# Patient Record
Sex: Female | Born: 1990 | Race: White | Hispanic: No | Marital: Single | Smoking: Never smoker
Health system: Southern US, Community
[De-identification: ages and names within clinical notes are randomized; demographics above are authoritative.]

## PROBLEM LIST (undated history)

## (undated) DIAGNOSIS — F329 Major depressive disorder, single episode, unspecified: Secondary | ICD-10-CM

## (undated) DIAGNOSIS — K589 Irritable bowel syndrome without diarrhea: Secondary | ICD-10-CM

## (undated) DIAGNOSIS — F32A Depression, unspecified: Secondary | ICD-10-CM

## (undated) DIAGNOSIS — I639 Cerebral infarction, unspecified: Secondary | ICD-10-CM

## (undated) DIAGNOSIS — I509 Heart failure, unspecified: Secondary | ICD-10-CM

## (undated) DIAGNOSIS — E559 Vitamin D deficiency, unspecified: Secondary | ICD-10-CM

## (undated) DIAGNOSIS — N809 Endometriosis, unspecified: Secondary | ICD-10-CM

## (undated) DIAGNOSIS — F4310 Post-traumatic stress disorder, unspecified: Secondary | ICD-10-CM

## (undated) DIAGNOSIS — E282 Polycystic ovarian syndrome: Secondary | ICD-10-CM

## (undated) DIAGNOSIS — B009 Herpesviral infection, unspecified: Secondary | ICD-10-CM

## (undated) DIAGNOSIS — I429 Cardiomyopathy, unspecified: Secondary | ICD-10-CM

## (undated) HISTORY — DX: Heart failure, unspecified: I50.9

## (undated) HISTORY — PX: TONSILLECTOMY: SUR1361

---
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
## 2012-05-13 DIAGNOSIS — R634 Abnormal weight loss: Secondary | ICD-10-CM | POA: Insufficient documentation

## 2012-05-13 DIAGNOSIS — Z8742 Personal history of other diseases of the female genital tract: Secondary | ICD-10-CM | POA: Insufficient documentation

## 2012-08-11 DIAGNOSIS — I491 Atrial premature depolarization: Secondary | ICD-10-CM | POA: Insufficient documentation

## 2013-02-26 DIAGNOSIS — Z8659 Personal history of other mental and behavioral disorders: Secondary | ICD-10-CM | POA: Insufficient documentation

## 2013-06-11 DIAGNOSIS — Z6835 Body mass index (BMI) 35.0-35.9, adult: Secondary | ICD-10-CM | POA: Insufficient documentation

## 2014-12-30 DIAGNOSIS — R200 Anesthesia of skin: Secondary | ICD-10-CM | POA: Insufficient documentation

## 2014-12-30 DIAGNOSIS — R519 Headache, unspecified: Secondary | ICD-10-CM | POA: Insufficient documentation

## 2014-12-30 DIAGNOSIS — R531 Weakness: Secondary | ICD-10-CM | POA: Insufficient documentation

## 2016-11-14 DIAGNOSIS — I472 Ventricular tachycardia, unspecified: Secondary | ICD-10-CM | POA: Insufficient documentation

## 2016-12-04 DIAGNOSIS — F4325 Adjustment disorder with mixed disturbance of emotions and conduct: Secondary | ICD-10-CM | POA: Insufficient documentation

## 2016-12-04 DIAGNOSIS — N760 Acute vaginitis: Secondary | ICD-10-CM | POA: Insufficient documentation

## 2016-12-04 DIAGNOSIS — F1210 Cannabis abuse, uncomplicated: Secondary | ICD-10-CM | POA: Insufficient documentation

## 2016-12-04 DIAGNOSIS — F4310 Post-traumatic stress disorder, unspecified: Secondary | ICD-10-CM | POA: Insufficient documentation

## 2018-10-31 DIAGNOSIS — E538 Deficiency of other specified B group vitamins: Secondary | ICD-10-CM | POA: Insufficient documentation

## 2018-10-31 DIAGNOSIS — A048 Other specified bacterial intestinal infections: Secondary | ICD-10-CM | POA: Insufficient documentation

## 2018-10-31 DIAGNOSIS — K5900 Constipation, unspecified: Secondary | ICD-10-CM | POA: Insufficient documentation

## 2018-10-31 DIAGNOSIS — K219 Gastro-esophageal reflux disease without esophagitis: Secondary | ICD-10-CM | POA: Insufficient documentation

## 2019-03-02 ENCOUNTER — Ambulatory Visit
Admission: EM | Admit: 2019-03-02 | Discharge: 2019-03-02 | Disposition: A | Discharge status: Home / Self Care | Payer: Medicaid Other | Attending: Family Medicine | Admitting: Family Medicine

## 2019-03-02 ENCOUNTER — Other Ambulatory Visit: Payer: Self-pay

## 2019-03-02 DIAGNOSIS — R202 Paresthesia of skin: Secondary | ICD-10-CM

## 2019-03-02 HISTORY — DX: Post-traumatic stress disorder, unspecified: F43.10

## 2019-03-02 HISTORY — DX: Irritable bowel syndrome, unspecified: K58.9

## 2019-03-02 HISTORY — DX: Polycystic ovarian syndrome: E28.2

## 2019-03-02 HISTORY — DX: Vitamin D deficiency, unspecified: E55.9

## 2019-03-02 HISTORY — DX: Depression, unspecified: F32.A

## 2019-03-02 HISTORY — DX: Cerebral infarction, unspecified: I63.9

## 2019-03-02 HISTORY — DX: Cardiomyopathy, unspecified: I42.9

## 2019-03-02 HISTORY — DX: Endometriosis, unspecified: N80.9

## 2019-03-02 MED ORDER — MELOXICAM 15 MG PO TABS: 15.0000 mg | ORAL_TABLET | Freq: Every day | ORAL | 0 refills | Status: DC
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
## 2019-03-02 NOTE — ED Triage Notes (Signed)
Pt c/o burning numbness to BL hands for the past couple of months and did a virtual with her PCP in [place] and did labs that showed lo vitamin D and was placed on weekly supplement, pt states the pain is worse at night and now having swelling in the hands that seems to improve throughout the day but worse at night, pt also asking for a referral to someone for depression.

## 2019-03-02 NOTE — Discharge Instructions (Signed)
Follow up with Primary Care Provider °

## 2019-06-06 ENCOUNTER — Other Ambulatory Visit: Payer: Self-pay

## 2019-06-06 ENCOUNTER — Encounter: Payer: Self-pay | Admitting: Emergency Medicine

## 2019-06-06 ENCOUNTER — Emergency Department
Admission: EM | Admit: 2019-06-06 | Discharge: 2019-06-06 | Disposition: A | Discharge status: Home / Self Care | Payer: Medicaid Other | Attending: Emergency Medicine | Admitting: Emergency Medicine

## 2019-06-06 ENCOUNTER — Emergency Department: Payer: Medicaid Other

## 2019-06-06 DIAGNOSIS — Z20828 Contact with and (suspected) exposure to other viral communicable diseases: Secondary | ICD-10-CM | POA: Insufficient documentation

## 2019-06-06 DIAGNOSIS — R0789 Other chest pain: Secondary | ICD-10-CM | POA: Insufficient documentation

## 2019-06-06 DIAGNOSIS — R200 Anesthesia of skin: Secondary | ICD-10-CM | POA: Insufficient documentation

## 2019-06-06 DIAGNOSIS — G609 Hereditary and idiopathic neuropathy, unspecified: Secondary | ICD-10-CM | POA: Insufficient documentation

## 2019-06-06 DIAGNOSIS — Z8673 Personal history of transient ischemic attack (TIA), and cerebral infarction without residual deficits: Secondary | ICD-10-CM | POA: Insufficient documentation

## 2019-06-06 DIAGNOSIS — Z79899 Other long term (current) drug therapy: Secondary | ICD-10-CM | POA: Insufficient documentation

## 2019-06-06 DIAGNOSIS — F1210 Cannabis abuse, uncomplicated: Secondary | ICD-10-CM | POA: Insufficient documentation

## 2019-06-06 DIAGNOSIS — M542 Cervicalgia: Secondary | ICD-10-CM

## 2019-06-06 DIAGNOSIS — Z91040 Latex allergy status: Secondary | ICD-10-CM | POA: Insufficient documentation

## 2019-06-06 DIAGNOSIS — R0602 Shortness of breath: Secondary | ICD-10-CM | POA: Insufficient documentation

## 2019-06-06 LAB — INFLUENZA PANEL BY PCR (TYPE A & B)
Influenza A By PCR: NEGATIVE
Influenza B By PCR: NEGATIVE

## 2019-06-06 LAB — BASIC METABOLIC PANEL
Anion gap: 8 (ref 5–15)
BUN: 19 mg/dL (ref 6–20)
CO2: 25 mmol/L (ref 22–32)
Calcium: 8.7 mg/dL — ABNORMAL LOW (ref 8.9–10.3)
Chloride: 105 mmol/L (ref 98–111)
Creatinine, Ser: 0.56 mg/dL (ref 0.44–1.00)
GFR calc Af Amer: >60 mL/min (ref >60)
GFR calc non Af Amer: >60 mL/min (ref >60)
Glucose, Bld: 103 mg/dL — ABNORMAL HIGH (ref 70–99)
Potassium: 3.6 mmol/L (ref 3.5–5.1)
Sodium: 138 mmol/L (ref 135–145)

## 2019-06-06 LAB — CBC
HCT: 39.4 % (ref 36.0–46.0)
Hemoglobin: 13.6 g/dL (ref 12.0–15.0)
MCH: 28.6 pg (ref 26.0–34.0)
MCHC: 34.5 g/dL (ref 30.0–36.0)
MCV: 82.8 fL (ref 80.0–100.0)
Platelets: 238 10*3/uL (ref 150–400)
RBC: 4.76 MIL/uL (ref 3.87–5.11)
RDW: 13 % (ref 11.5–15.5)
WBC: 5.6 10*3/uL (ref 4.0–10.5)
nRBC: 0 % (ref 0.0–0.2)

## 2019-06-06 LAB — HEPATIC FUNCTION PANEL
ALT: 36 U/L (ref 0–44)
AST: 26 U/L (ref 15–41)
Albumin: 4 g/dL (ref 3.5–5.0)
Alkaline Phosphatase: 45 U/L (ref 38–126)
Bilirubin, Direct: <0.1 mg/dL (ref 0.0–0.2)
Total Bilirubin: 0.4 mg/dL (ref 0.3–1.2)
Total Protein: 7 g/dL (ref 6.5–8.1)

## 2019-06-06 LAB — FIBRIN DERIVATIVES D-DIMER (ARMC ONLY): Fibrin derivatives D-dimer (ARMC): 130.2 ng/mL (FEU) (ref 0.00–499.00)

## 2019-06-06 LAB — BRAIN NATRIURETIC PEPTIDE: B Natriuretic Peptide: 30 pg/mL (ref 0.0–100.0)

## 2019-06-06 LAB — TROPONIN I (HIGH SENSITIVITY): Troponin I (High Sensitivity): 3 ng/L (ref <18)

## 2019-06-06 LAB — SARS CORONAVIRUS 2 (TAT 6-24 HRS): SARS Coronavirus 2: NEGATIVE

## 2019-06-06 LAB — POCT PREGNANCY, URINE: Preg Test, Ur: NEGATIVE

## 2019-06-06 LAB — TSH: TSH: 0.61 u[IU]/mL (ref 0.350–4.500)

## 2019-06-06 MED ORDER — PREDNISONE 20 MG PO TABS
60.0000 mg | ORAL_TABLET | Freq: Once | ORAL | Status: AC
  Administered 2019-06-06: 60 mg via ORAL
  Filled 2019-06-06: qty 3

## 2019-06-06 MED ORDER — SODIUM CHLORIDE 0.9% FLUSH: 3.0000 mL | Freq: Once | INTRAVENOUS | Status: DC

## 2019-06-06 MED ORDER — PREDNISONE 10 MG (21) PO TBPK: ORAL_TABLET | ORAL | 0 refills | Status: DC

## 2019-06-06 NOTE — ED Triage Notes (Signed)
States has had L neck pain x 3 days. SOB x 3 days ago. Bilateral hand pain x 3 months. History of cardiomyopathy related to flu episode.

## 2019-06-06 NOTE — ED Provider Notes (Signed)
[facility] Provider Note ____________________________________________   First MD Initiated Contact with Patient 06/06/19 1313     (approximate)  I have reviewed the triage vital signs and the nursing notes.   HISTORY  Chief Complaint Neck Pain and Hand Pain  HPI Lori Raymond is a 28 y.o. female presenting to the emergency department for treatment and evaluation of left-sided neck pain for the past 3 days with some shortness of breath and chest heaviness.  She also reports having bilateral hand numbness for the past 3 months. She reports at last physical 3 months ago her Vitamin D level was "non existent" and was placed on medication. Initially the Vitamin D supplement helped with the numbness in her hands, but no longer works. Hands and ankles seem swollen in the mornings and get better throughout the day. Left side neck pain is not similar to previous muscle strains and feels "deeper." She denies injury.     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      Past Medical History:  Diagnosis Date   Cardiomyopathy (HCC)    Depression    Endometriosis    IBS (irritable bowel syndrome)    Polycystic ovarian disease    PTSD (post-traumatic stress disorder)    Stroke (HCC)    Vitamin D deficiency     There are no active problems to display for this patient.   Past Surgical History:  Procedure Laterality Date   TONSILLECTOMY      Prior to Admission medications   Medication Sig Start Date End Date Taking? Authorizing Provider  meloxicam (MOBIC) 15 MG tablet Take 1 tablet (15 mg total) by mouth daily. 03/02/19   Payton Mccallum, MD  predniSONE (STERAPRED UNI-PAK 21 TAB) 10 MG (21) TBPK tablet Take 6 tablets on the first day and decrease by 1 tablet each day until finished. 06/06/19   Chidera Dearcos, Kasandra Knudsen, FNP    Allergies Lexapro  [escitalopram oxalate], Latex, and Sulfa antibiotics  No family history on file.  Social History Social History   Tobacco Use   Smoking status: Never Smoker   Smokeless tobacco: Never Used  Substance Use Topics   Alcohol use: Not Currently    Frequency: Never   Drug use: Yes    Types: Marijuana    Review of Systems  Constitutional: No fever/chills Eyes: No visual changes. ENT: No sore throat. Cardiovascular: Positive for chest pressure. Respiratory: Occasional shortness of breath. Gastrointestinal: No abdominal pain.  No nausea, no vomiting.  No diarrhea.  No constipation. Genitourinary: Negative for dysuria. Musculoskeletal: Negative for back pain. Positive for left side neck pain. Positive for bilateral hand pain. Skin: Negative for rash. Neurological: Negative for headaches or focal weakness. Positive for bilateral hand numbness and tingling. ____________________________________________   PHYSICAL EXAM:  VITAL SIGNS: ED Triage Vitals  Enc Vitals Group     BP 06/06/19 1248 116/68     Pulse Rate 06/06/19 1248 (!) 54     Resp 06/06/19 1248 16     Temp 06/06/19 1248 98.4 F (36.9 C)     Temp Source 06/06/19 1248 Oral     SpO2 06/06/19 1248 98 %     Weight 06/06/19 1300 175 lb (79.4 kg)     Height 06/06/19 1300 5\' 2"  (1.575 m)     Head Circumference --      Peak Flow --  Pain Score 06/06/19 1300 6     Pain Loc --      Pain Edu? --      Excl. in [place]? --     Constitutional: Alert and oriented. Well appearing and in no acute distress. Eyes: Conjunctivae are normal. Head: Atraumatic. Nose: No congestion/rhinnorhea. Mouth/Throat: Mucous membranes are moist. Oropharynx non-erythematous. Neck: No stridor. FROM. Pain is diffuse over left lateral neck.  Hematological/Lymphatic/Immunilogical: No cervical lymphadenopathy. Cardiovascular: Normal rate, regular rhythm. Grossly normal heart sounds.  Good peripheral circulation. No pitting edema in lower  extremities. Respiratory: Normal respiratory effort.  No retractions. Lungs CTAB. Gastrointestinal: Soft and nontender. No distention. No abdominal bruits. No CVA tenderness. Musculoskeletal: No lower extremity tenderness nor edema.  No joint effusions. Neurologic:  Normal speech and language. No gross focal neurologic deficits are appreciated. Grip strength is strong and equal bilaterally. Skin:  Skin is warm, dry and intact. No rash noted. Psychiatric: Mood and affect are normal. Speech and behavior are normal.  ____________________________________________   LABS (all labs ordered are listed, but only abnormal results are displayed)  Labs Reviewed  BASIC METABOLIC PANEL - Abnormal; Notable for the following components:      Result Value   Glucose, Bld 103 (*)    Calcium 8.7 (*)    All other components within normal limits  SARS CORONAVIRUS 2 (TAT 6-24 HRS)  CBC  INFLUENZA PANEL BY PCR (TYPE A & B)  HEPATIC FUNCTION PANEL  TSH  BRAIN NATRIURETIC PEPTIDE  FIBRIN DERIVATIVES D-DIMER ([facility] ONLY)  POC URINE PREG, ED  POCT PREGNANCY, URINE  TROPONIN I (HIGH SENSITIVITY)  TROPONIN I (HIGH SENSITIVITY)   ____________________________________________  EKG  ED ECG REPORT I, Katey Barrie, FNP-BC personally viewed and interpreted this ECG.   Date: 06/06/2019  EKG Time: 1251  Rate: 55  Rhythm: sinus bradycardia  Axis: rightward  Intervals:none  ST&T Change: no ST elevation  ____________________________________________  RADIOLOGY  ED MD interpretation:    Chest x-ray is negative for acute cardiopulmonary abnormality per radiology.  Official radiology report(s): Dg Chest 2 View  Result Date: 06/06/2019 CLINICAL DATA:  Shortness of breath x3 days EXAM: CHEST - 2 VIEW COMPARISON:  None. FINDINGS: Lungs are clear.  No pleural effusion or pneumothorax. The heart is normal in size. Visualized osseous structures are within normal limits. IMPRESSION: Normal chest radiographs.  Electronically Signed   By: Julian Hy M.D.   On: 06/06/2019 13:50    ____________________________________________   PROCEDURES  Procedure(s) performed (including Critical Care):  Procedures  ____________________________________________   INITIAL IMPRESSION / ASSESSMENT AND PLAN     29 year old female presenting to the emergency department with complaints as described in the HPI.  Plan will be to perform serial cardiac enzymes as well as BNP, TSH and basic labs, get a chest x-ray, test for COVID-19 and influenza.  DIFFERENTIAL DIAGNOSIS  Peripheral neuropathy, cardiomyopathy, musculoskeletal pain  ED COURSE  Work-up including D-dimer, CBC, CMP, influenza, COVID-19, TSH, BNP, and chest x-ray are all reassuring.  Patient will be treated with prednisone for the peripheral neuropathy in her hands which will likely help with the pain in the left side of her neck as well.  She was encouraged to call and schedule follow-up appointment with neurology for neuropathy and the number was provided.  She is to see her primary care provider for all other medical complaints.  She is encouraged to return to the emergency department for symptoms of change or worsen if she is unable to schedule appointment.  ____________________________________________   FINAL CLINICAL IMPRESSION(S) / ED DIAGNOSES  Final diagnoses:  Idiopathic peripheral neuropathy  Neck pain     ED Discharge Orders         Ordered    predniSONE (STERAPRED UNI-PAK 21 TAB) 10 MG (21) TBPK tablet     06/06/19 1628           Note:  This document was prepared using Dragon voice recognition software and may include unintentional dictation errors.   Chinita Pesterriplett, Kalvin Buss B, FNP 06/06/19 1712    Concha SeFunke, Mary E, MD 06/07/19 1131

## 2019-06-06 NOTE — ED Triage Notes (Signed)
FIRST NURSE NOTE-here for tingling in both hands. Pt c/o neck pain and hx of cardiac problems.  Pulled for EKG

## 2019-07-20 ENCOUNTER — Telehealth: Payer: Self-pay

## 2019-07-20 DIAGNOSIS — R001 Bradycardia, unspecified: Secondary | ICD-10-CM | POA: Insufficient documentation

## 2019-07-20 DIAGNOSIS — R0602 Shortness of breath: Secondary | ICD-10-CM | POA: Insufficient documentation

## 2019-07-20 DIAGNOSIS — I5022 Chronic systolic (congestive) heart failure: Secondary | ICD-10-CM | POA: Insufficient documentation

## 2019-07-20 NOTE — Telephone Encounter (Signed)
Patient calling for appt directions but no appt in Epic.  Patient being seen at [facility].  Directions given. Closing encounter.

## 2019-09-08 DIAGNOSIS — M25531 Pain in right wrist: Secondary | ICD-10-CM | POA: Insufficient documentation

## 2019-09-08 DIAGNOSIS — G5601 Carpal tunnel syndrome, right upper limb: Secondary | ICD-10-CM | POA: Insufficient documentation

## 2019-09-08 DIAGNOSIS — R200 Anesthesia of skin: Secondary | ICD-10-CM | POA: Insufficient documentation

## 2019-09-08 DIAGNOSIS — R202 Paresthesia of skin: Secondary | ICD-10-CM | POA: Insufficient documentation

## 2019-09-15 ENCOUNTER — Emergency Department: Payer: Medicaid Other

## 2019-09-15 ENCOUNTER — Emergency Department
Admission: EM | Admit: 2019-09-15 | Discharge: 2019-09-15 | Discharge status: Against Medical Advice | Payer: Medicaid Other | Attending: Emergency Medicine | Admitting: Emergency Medicine

## 2019-09-15 ENCOUNTER — Other Ambulatory Visit: Payer: Self-pay

## 2019-09-15 ENCOUNTER — Encounter: Payer: Self-pay | Admitting: Emergency Medicine

## 2019-09-15 DIAGNOSIS — R079 Chest pain, unspecified: Secondary | ICD-10-CM | POA: Insufficient documentation

## 2019-09-15 DIAGNOSIS — Z5321 Procedure and treatment not carried out due to patient leaving prior to being seen by health care provider: Secondary | ICD-10-CM | POA: Diagnosis not present

## 2019-09-15 LAB — TROPONIN I (HIGH SENSITIVITY)
Troponin I (High Sensitivity): <2 ng/L (ref <18)
Troponin I (High Sensitivity): <2 ng/L (ref <18)

## 2019-09-15 LAB — BASIC METABOLIC PANEL
Anion gap: 8 (ref 5–15)
BUN: 16 mg/dL (ref 6–20)
CO2: 29 mmol/L (ref 22–32)
Calcium: 8.6 mg/dL — ABNORMAL LOW (ref 8.9–10.3)
Chloride: 103 mmol/L (ref 98–111)
Creatinine, Ser: 0.71 mg/dL (ref 0.44–1.00)
GFR calc Af Amer: >60 mL/min (ref >60)
GFR calc non Af Amer: >60 mL/min (ref >60)
Glucose, Bld: 98 mg/dL (ref 70–99)
Potassium: 4.1 mmol/L (ref 3.5–5.1)
Sodium: 140 mmol/L (ref 135–145)

## 2019-09-15 LAB — CBC
HCT: 42 % (ref 36.0–46.0)
Hemoglobin: 14.3 g/dL (ref 12.0–15.0)
MCH: 29.6 pg (ref 26.0–34.0)
MCHC: 34 g/dL (ref 30.0–36.0)
MCV: 87 fL (ref 80.0–100.0)
Platelets: 254 10*3/uL (ref 150–400)
RBC: 4.83 MIL/uL (ref 3.87–5.11)
RDW: 12.8 % (ref 11.5–15.5)
WBC: 6.5 10*3/uL (ref 4.0–10.5)
nRBC: 0 % (ref 0.0–0.2)

## 2019-09-15 LAB — POCT PREGNANCY, URINE: Preg Test, Ur: NEGATIVE

## 2019-09-15 NOTE — ED Notes (Signed)
Pt ambulatory to STAT desk; pt expresses frustration over long wait, that pts are going and coming before her, and that she doesn't feel well; pt instr on triage process and the separate examination areas; pt reassured of protocol findings thus far and that we are awaiting an exam room so she can be further evaluated by a provider for any further testing needs; pt voices understanding but st she can no longer wait any longer; pt instr to f/u with PCP and to return for any new or worsening symptoms

## 2019-09-15 NOTE — ED Notes (Signed)
Pt sitting in lobby with no distress noted; pt updated on wait time and voices good understanding of repeat blood work; pt taken to triage for vs and troponin

## 2019-09-15 NOTE — ED Triage Notes (Signed)
Pt in via POV, reports left side chest pain with radiation into neck x 2 days, reports generalized weakness as well.  Ambulatory to triage, NAD noted at this time.

## 2019-09-16 ENCOUNTER — Telehealth: Payer: Self-pay | Admitting: Emergency Medicine

## 2019-09-16 NOTE — Telephone Encounter (Signed)
Called patient due to lwot to inquire about condition and follow up plans. She says she thinks she has covid, but [facility] and [facility] was [facility].  She says she feels a little better today.  I told her she should call her doctor and let them know she did not stay due to wait and that results of tests are available.  She will call.

## 2019-10-03 ENCOUNTER — Emergency Department: Payer: Medicaid Other

## 2019-10-03 ENCOUNTER — Other Ambulatory Visit: Payer: Self-pay

## 2019-10-03 ENCOUNTER — Encounter: Payer: Self-pay | Admitting: Emergency Medicine

## 2019-10-03 ENCOUNTER — Emergency Department
Admission: EM | Admit: 2019-10-03 | Discharge: 2019-10-03 | Disposition: A | Discharge status: Home / Self Care | Payer: Medicaid Other | Attending: Student | Admitting: Student

## 2019-10-03 DIAGNOSIS — Z79899 Other long term (current) drug therapy: Secondary | ICD-10-CM | POA: Insufficient documentation

## 2019-10-03 DIAGNOSIS — R0602 Shortness of breath: Secondary | ICD-10-CM | POA: Diagnosis present

## 2019-10-03 DIAGNOSIS — U071 COVID-19: Secondary | ICD-10-CM | POA: Diagnosis not present

## 2019-10-03 DIAGNOSIS — Z91040 Latex allergy status: Secondary | ICD-10-CM | POA: Insufficient documentation

## 2019-10-03 LAB — CBC WITH DIFFERENTIAL/PLATELET
Abs Immature Granulocytes: 0.01 10*3/uL (ref 0.00–0.07)
Basophils Absolute: 0 10*3/uL (ref 0.0–0.1)
Basophils Relative: 0 %
Eosinophils Absolute: 0.1 10*3/uL (ref 0.0–0.5)
Eosinophils Relative: 1 %
HCT: 43.1 % (ref 36.0–46.0)
Hemoglobin: 14.1 g/dL (ref 12.0–15.0)
Immature Granulocytes: 0 %
Lymphocytes Relative: 42 %
Lymphs Abs: 2.2 10*3/uL (ref 0.7–4.0)
MCH: 28.2 pg (ref 26.0–34.0)
MCHC: 32.7 g/dL (ref 30.0–36.0)
MCV: 86.2 fL (ref 80.0–100.0)
Monocytes Absolute: 0.4 10*3/uL (ref 0.1–1.0)
Monocytes Relative: 7 %
Neutro Abs: 2.5 10*3/uL (ref 1.7–7.7)
Neutrophils Relative %: 50 %
Platelets: 248 10*3/uL (ref 150–400)
RBC: 5 MIL/uL (ref 3.87–5.11)
RDW: 12.6 % (ref 11.5–15.5)
WBC: 5.2 10*3/uL (ref 4.0–10.5)
nRBC: 0 % (ref 0.0–0.2)

## 2019-10-03 LAB — BASIC METABOLIC PANEL
Anion gap: 10 (ref 5–15)
BUN: 19 mg/dL (ref 6–20)
CO2: 25 mmol/L (ref 22–32)
Calcium: 8.9 mg/dL (ref 8.9–10.3)
Chloride: 105 mmol/L (ref 98–111)
Creatinine, Ser: 0.66 mg/dL (ref 0.44–1.00)
GFR calc Af Amer: >60 mL/min (ref >60)
GFR calc non Af Amer: >60 mL/min (ref >60)
Glucose, Bld: 91 mg/dL (ref 70–99)
Potassium: 3.7 mmol/L (ref 3.5–5.1)
Sodium: 140 mmol/L (ref 135–145)

## 2019-10-03 LAB — TROPONIN I (HIGH SENSITIVITY): Troponin I (High Sensitivity): 2 ng/L (ref <18)

## 2019-10-03 LAB — FERRITIN: Ferritin: 39 ng/mL (ref 11–307)

## 2019-10-03 LAB — C-REACTIVE PROTEIN: CRP: 0.7 mg/dL (ref <1.0)

## 2019-10-03 LAB — LACTATE DEHYDROGENASE: LDH: 157 U/L (ref 98–192)

## 2019-10-03 LAB — BRAIN NATRIURETIC PEPTIDE: B Natriuretic Peptide: 27 pg/mL (ref 0.0–100.0)

## 2019-10-03 NOTE — ED Triage Notes (Signed)
Pt to ED via POV stating that she got positive COVID results this morning. Pt states that she is having heaviness on her chest. Pt has hx/o heart failure. Pt states that she feels horrible, pt still cannot smell. Pt states that she felt very confused this morning. Pt SpO2 100% in triage. Pt is talking in complete sentences in NAD.

## 2019-10-03 NOTE — ED Notes (Signed)
PT ambulated down hall, O2 sats remained 96-100% while ambulating.  Pt states she "feels like I ran a marathon".

## 2019-10-03 NOTE — ED Notes (Signed)
See triage note. Pt states that she was diagnosed with COVID 2 days ago but has been feeling back for 14+ days now with negative test results during that time until her recent positive results. Pt reports tightness in her chest as of lately. Pt requesting oxygen, this RN informed her that her oxygen saturation was at 100% so supplemental oxygen was not needed. Pt verbalized understanding.

## 2019-10-03 NOTE — ED Provider Notes (Signed)
[facility] Emergency Department Provider Note  ____________________________________________  Time seen: Approximately 3:43 PM  I have reviewed the triage vital signs and the nursing notes.   HISTORY  Chief Complaint Shortness of Breath    HPI Lori Raymond is a 29 y.o. female that presents to emergency department for evaluation ofNasal nasal congestion, lack of smell, cough, chest pressure for 2 weeks.  Patient states that she thought March 21.  She was tested for Covid at this time and tested negative.  Patient states that she went to a baby shower on March 24.  At this baby shower, everyone came down with COVID-19.  A couple of days later, she started spiking high fevers and developed a cough.  She no longer has high fevers.  Her cough and chest pressure has continued.  Patient does not have shortness of breath while at rest but does have shortness of breath while ambulating distances.  Cough is not productive.  Patient was tested for Covid 3 days ago and test came back positive today.  Patient has a history of bradycardia and cardiomyopathy and follows cardiology for this.  She is supposed to have some more tests ordered on her heart but her insurance has not approved these test.  Past Medical History:  Diagnosis Date  . Cardiomyopathy (HCC)   . Depression   . Endometriosis   . IBS (irritable bowel syndrome)   . Polycystic ovarian disease   . PTSD (post-traumatic stress disorder)   . Stroke (HCC)   . Vitamin D deficiency     There are no problems to display for this patient.   Past Surgical History:  Procedure Laterality Date  . TONSILLECTOMY      Prior to Admission medications   Medication Sig Start Date End Date Taking? Authorizing Provider  meloxicam (MOBIC) 15 MG tablet Take 1 tablet (15 mg total) by mouth daily. 03/02/19   Payton Mccallum, MD  predniSONE (STERAPRED UNI-PAK 21 TAB) 10 MG (21) TBPK tablet Take 6 tablets on the first day and  decrease by 1 tablet each day until finished. 06/06/19   Triplett, Kasandra Knudsen, FNP    Allergies Lexapro [escitalopram oxalate], Latex, and Sulfa antibiotics  No family history on file.  Social History Social History   Tobacco Use  . Smoking status: Never Smoker  . Smokeless tobacco: Never Used  Substance Use Topics  . Alcohol use: Not Currently  . Drug use: Yes    Types: Marijuana     Review of Systems  Constitutional: No fever/chills Eyes: No visual changes. No discharge. ENT: Positive for congestion and rhinorrhea. Cardiovascular: Positive for chest pressure. Respiratory: Positive for cough.  Positive for shortness of breath with ambulation. Gastrointestinal: No abdominal pain.  No nausea, no vomiting.  No diarrhea.  No constipation. Musculoskeletal: Negative for musculoskeletal pain. Skin: Negative for rash, abrasions, lacerations, ecchymosis. Neurological: Negative for headaches.   ____________________________________________   PHYSICAL EXAM:  VITAL SIGNS: ED Triage Vitals  Enc Vitals Group     BP 10/03/19 0845 125/75     Pulse Rate 10/03/19 0845 (!) 56     Resp 10/03/19 0845 16     Temp 10/03/19 0845 97.7 F (36.5 C)     Temp Source 10/03/19 0845 Oral     SpO2 10/03/19 0845 100 %     Weight 10/03/19 0846 180 lb (81.6 kg)     Height 10/03/19 0846 5\' 2"  (1.575 m)     Head Circumference --  Peak Flow --      Pain Score 10/03/19 0845 8     Pain Loc --      Pain Edu? --      Excl. in [place]? --      Constitutional: Alert and oriented. Well appearing and in no acute distress. Eyes: Conjunctivae are normal. PERRL. EOMI. No discharge. Head: Atraumatic. ENT: No frontal and maxillary sinus tenderness.      Ears: Tympanic membranes pearly gray with good landmarks. No discharge.      Nose: Mild congestion/rhinnorhea.      Mouth/Throat: Mucous membranes are moist. Oropharynx non-erythematous. Tonsils not enlarged. No exudates. Uvula midline. Neck: No stridor.    Hematological/Lymphatic/Immunilogical: No cervical lymphadenopathy. Cardiovascular: Normal rate, regular rhythm.  Good peripheral circulation. Respiratory: Normal respiratory effort without tachypnea or retractions. Lungs CTAB. Good air entry to the bases with no decreased or absent breath sounds. Gastrointestinal: Bowel sounds 4 quadrants. Soft and nontender to palpation. No guarding or rigidity. No palpable masses. No distention. Musculoskeletal: Full range of motion to all extremities. No gross deformities appreciated. Neurologic:  Normal speech and language. No gross focal neurologic deficits are appreciated.  Skin:  Skin is warm, dry and intact. No rash noted. Psychiatric: Mood and affect are normal. Speech and behavior are normal. Patient exhibits appropriate insight and judgement.   ____________________________________________   LABS (all labs ordered are listed, but only abnormal results are displayed)  Labs Reviewed  BASIC METABOLIC PANEL  BRAIN NATRIURETIC PEPTIDE  CBC WITH DIFFERENTIAL/PLATELET  C-REACTIVE PROTEIN  LACTATE DEHYDROGENASE  FERRITIN  TROPONIN I (HIGH SENSITIVITY)  TROPONIN I (HIGH SENSITIVITY)   ____________________________________________  EKG   ____________________________________________  RADIOLOGY Robinette Haines, personally viewed and evaluated these images (plain radiographs) as part of my medical decision making, as well as reviewing the written report by the radiologist.  DG Chest 2 View  Result Date: 10/03/2019 CLINICAL DATA:  Shortness of breath.  COVID-19 positive EXAM: CHEST - 2 VIEW COMPARISON:  September 15, 2019 FINDINGS: Lungs are clear. Heart size and pulmonary vascularity are normal. No adenopathy. No bone lesions. IMPRESSION: Lungs clear.  No adenopathy. Electronically Signed   By: Lowella Grip III M.D.   On: 10/03/2019 09:49    ____________________________________________    PROCEDURES  Procedure(s) performed:     Procedures    Medications - No data to display   ____________________________________________   INITIAL IMPRESSION / ASSESSMENT AND PLAN / ED COURSE  Pertinent labs & imaging results that were available during my care of the patient were reviewed by me and considered in my medical decision making (see chart for details).  Review of the [place] CSRS was performed in accordance of the [place] prior to dispensing any controlled drugs.     Patient presented to the emergency department for evaluation of COVID-19. Vital signs and exam are reassuring.  CBC, CMP, lactate dehydrogenase, ferritin, BNP, troponin are within normal limits.  Chest x-ray negative for acute cardiopulmonary processes.  Sinus bradycardia on EKG.  Oxygen saturations remained above 96% with ambulation.  Patient appears well and is staying well hydrated. Patient feels comfortable going home.  Patient is to follow up with primary care and cardiology as needed or otherwise directed. Patient is given ED precautions to return to the ED for any worsening or new symptoms.  Malak Duchesneau was evaluated in Emergency Department on 10/03/2019 for the symptoms described in the history of present illness. She was evaluated in the context of the global COVID-19 pandemic, which necessitated  consideration that the patient might be at risk for infection with the SARS-CoV-2 virus that causes COVID-19. Institutional protocols and algorithms that pertain to the evaluation of patients at risk for COVID-19 are in a state of rapid change based on information released by regulatory bodies including the CDC and federal and state organizations. These policies and algorithms were followed during the patient's care in the ED.   ____________________________________________  FINAL CLINICAL IMPRESSION(S) / ED DIAGNOSES  Final diagnoses:  COVID-19      NEW MEDICATIONS STARTED DURING THIS VISIT:  ED Discharge Orders    None          This chart  was dictated using voice recognition software/Dragon. Despite best efforts to proofread, errors can occur which can change the meaning. Any change was purely unintentional.    Enid Derry, PA-C 10/03/19 1557    Miguel Aschoff., MD 10/03/19 1816

## 2019-10-26 ENCOUNTER — Ambulatory Visit: Payer: Medicaid Other | Attending: Neurology | Admitting: Speech Pathology

## 2019-11-02 ENCOUNTER — Encounter: Payer: Medicaid Other | Admitting: Speech Pathology

## 2019-11-04 ENCOUNTER — Encounter: Payer: Medicaid Other | Admitting: Speech Pathology

## 2019-11-09 ENCOUNTER — Encounter: Payer: Medicaid Other | Admitting: Speech Pathology

## 2019-11-11 ENCOUNTER — Encounter: Payer: Medicaid Other | Admitting: Speech Pathology

## 2019-11-19 ENCOUNTER — Encounter: Payer: Medicaid Other | Admitting: Speech Pathology

## 2019-11-23 ENCOUNTER — Encounter: Payer: Medicaid Other | Admitting: Speech Pathology

## 2019-11-25 ENCOUNTER — Encounter: Payer: Medicaid Other | Admitting: Speech Pathology

## 2019-12-02 ENCOUNTER — Encounter: Payer: Medicaid Other | Admitting: Speech Pathology

## 2019-12-07 ENCOUNTER — Encounter: Payer: Medicaid Other | Admitting: Speech Pathology

## 2019-12-09 ENCOUNTER — Encounter: Payer: Medicaid Other | Admitting: Speech Pathology

## 2019-12-14 ENCOUNTER — Encounter: Payer: Medicaid Other | Admitting: Speech Pathology

## 2019-12-16 ENCOUNTER — Encounter: Payer: Medicaid Other | Admitting: Speech Pathology

## 2019-12-21 ENCOUNTER — Encounter: Payer: Medicaid Other | Admitting: Speech Pathology

## 2019-12-23 ENCOUNTER — Encounter: Payer: Medicaid Other | Admitting: Speech Pathology

## 2019-12-28 ENCOUNTER — Encounter: Payer: Medicaid Other | Admitting: Speech Pathology

## 2019-12-30 ENCOUNTER — Encounter: Payer: Medicaid Other | Admitting: Speech Pathology

## 2020-01-25 ENCOUNTER — Emergency Department: Payer: Medicaid Other

## 2020-01-25 ENCOUNTER — Other Ambulatory Visit: Payer: Self-pay

## 2020-01-25 ENCOUNTER — Encounter: Payer: Self-pay | Admitting: Emergency Medicine

## 2020-01-25 ENCOUNTER — Emergency Department
Admission: EM | Admit: 2020-01-25 | Discharge: 2020-01-25 | Disposition: A | Discharge status: Home / Self Care | Payer: Medicaid Other | Attending: Emergency Medicine | Admitting: Emergency Medicine

## 2020-01-25 DIAGNOSIS — O26892 Other specified pregnancy related conditions, second trimester: Secondary | ICD-10-CM | POA: Diagnosis present

## 2020-01-25 DIAGNOSIS — Z3A16 16 weeks gestation of pregnancy: Secondary | ICD-10-CM | POA: Diagnosis not present

## 2020-01-25 DIAGNOSIS — Z5321 Procedure and treatment not carried out due to patient leaving prior to being seen by health care provider: Secondary | ICD-10-CM | POA: Diagnosis not present

## 2020-01-25 DIAGNOSIS — R1031 Right lower quadrant pain: Secondary | ICD-10-CM | POA: Diagnosis not present

## 2020-01-25 LAB — COMPREHENSIVE METABOLIC PANEL
ALT: 24 U/L (ref 0–44)
AST: 22 U/L (ref 15–41)
Albumin: 4.1 g/dL (ref 3.5–5.0)
Alkaline Phosphatase: 40 U/L (ref 38–126)
Anion gap: 6 (ref 5–15)
BUN: 11 mg/dL (ref 6–20)
CO2: 27 mmol/L (ref 22–32)
Calcium: 8.6 mg/dL — ABNORMAL LOW (ref 8.9–10.3)
Chloride: 106 mmol/L (ref 98–111)
Creatinine, Ser: 0.67 mg/dL (ref 0.44–1.00)
GFR calc Af Amer: >60 mL/min (ref >60)
GFR calc non Af Amer: >60 mL/min (ref >60)
Glucose, Bld: 101 mg/dL — ABNORMAL HIGH (ref 70–99)
Potassium: 4.3 mmol/L (ref 3.5–5.1)
Sodium: 139 mmol/L (ref 135–145)
Total Bilirubin: 0.6 mg/dL (ref 0.3–1.2)
Total Protein: 7.2 g/dL (ref 6.5–8.1)

## 2020-01-25 LAB — URINALYSIS, COMPLETE (UACMP) WITH MICROSCOPIC
Bacteria, UA: NONE SEEN
Bilirubin Urine: NEGATIVE
Glucose, UA: NEGATIVE mg/dL
Hgb urine dipstick: NEGATIVE
Ketones, ur: NEGATIVE mg/dL
Leukocytes,Ua: NEGATIVE
Nitrite: NEGATIVE
Protein, ur: NEGATIVE mg/dL
Specific Gravity, Urine: 1.025 (ref 1.005–1.030)
pH: 7 (ref 5.0–8.0)

## 2020-01-25 LAB — CBC
HCT: 42 % (ref 36.0–46.0)
Hemoglobin: 13.7 g/dL (ref 12.0–15.0)
MCH: 28.4 pg (ref 26.0–34.0)
MCHC: 32.6 g/dL (ref 30.0–36.0)
MCV: 87 fL (ref 80.0–100.0)
Platelets: 291 10*3/uL (ref 150–400)
RBC: 4.83 MIL/uL (ref 3.87–5.11)
RDW: 13 % (ref 11.5–15.5)
WBC: 5.4 10*3/uL (ref 4.0–10.5)
nRBC: 0 % (ref 0.0–0.2)

## 2020-01-25 LAB — LIPASE, BLOOD: Lipase: 36 U/L (ref 11–51)

## 2020-01-25 LAB — HCG, QUANTITATIVE, PREGNANCY: hCG, Beta Chain, Quant, S: 2318 m[IU]/mL — ABNORMAL HIGH (ref <5)

## 2020-01-25 MED ORDER — SODIUM CHLORIDE 0.9% FLUSH: 3.0000 mL | Freq: Once | INTRAVENOUS | Status: DC

## 2020-01-25 NOTE — ED Notes (Signed)
hcg URINE POC TEST IS POSITIVE.  METER NOT WORKING.

## 2020-01-25 NOTE — ED Notes (Signed)
Unable to doppler FHT in triage 

## 2020-01-25 NOTE — ED Notes (Signed)
Pt reported that she was leaving and would follow up with her OB tomorrow

## 2020-01-25 NOTE — ED Triage Notes (Signed)
C/O RLQ abdominal pain.  States just found out she was pregnant.  States she is 16 weeks, has not had any prenatal care.  Has appointment, first appointment, with Dr. Elesa Massed tomorrow.  States pain started 3-4 days ago.  AAOx3.  Skin warm and dry. NAD

## 2020-04-22 DIAGNOSIS — H93A3 Pulsatile tinnitus, bilateral: Secondary | ICD-10-CM | POA: Insufficient documentation

## 2020-06-13 DIAGNOSIS — B009 Herpesviral infection, unspecified: Secondary | ICD-10-CM | POA: Insufficient documentation

## 2020-08-16 DIAGNOSIS — G5602 Carpal tunnel syndrome, left upper limb: Secondary | ICD-10-CM | POA: Insufficient documentation

## 2020-08-17 DIAGNOSIS — I519 Heart disease, unspecified: Secondary | ICD-10-CM | POA: Insufficient documentation

## 2020-11-24 DIAGNOSIS — M654 Radial styloid tenosynovitis [de Quervain]: Secondary | ICD-10-CM | POA: Insufficient documentation

## 2021-03-17 ENCOUNTER — Emergency Department
Admission: EM | Admit: 2021-03-17 | Discharge: 2021-03-17 | Disposition: A | Discharge status: Home / Self Care | Payer: Medicaid Other | Attending: Emergency Medicine | Admitting: Emergency Medicine

## 2021-03-17 ENCOUNTER — Emergency Department: Payer: Medicaid Other

## 2021-03-17 ENCOUNTER — Other Ambulatory Visit: Payer: Self-pay

## 2021-03-17 DIAGNOSIS — Z28310 Unvaccinated for covid-19: Secondary | ICD-10-CM | POA: Diagnosis not present

## 2021-03-17 DIAGNOSIS — Z91040 Latex allergy status: Secondary | ICD-10-CM | POA: Insufficient documentation

## 2021-03-17 DIAGNOSIS — U071 COVID-19: Secondary | ICD-10-CM | POA: Diagnosis not present

## 2021-03-17 DIAGNOSIS — R0602 Shortness of breath: Secondary | ICD-10-CM | POA: Diagnosis present

## 2021-03-17 LAB — URINALYSIS, COMPLETE (UACMP) WITH MICROSCOPIC
Bacteria, UA: NONE SEEN
Bilirubin Urine: NEGATIVE
Glucose, UA: NEGATIVE mg/dL
Hgb urine dipstick: NEGATIVE
Ketones, ur: NEGATIVE mg/dL
Leukocytes,Ua: NEGATIVE
Nitrite: NEGATIVE
Protein, ur: NEGATIVE mg/dL
Specific Gravity, Urine: 1.026 (ref 1.005–1.030)
pH: 6 (ref 5.0–8.0)

## 2021-03-17 LAB — CBC WITH DIFFERENTIAL/PLATELET
Abs Immature Granulocytes: 0.01 10*3/uL (ref 0.00–0.07)
Basophils Absolute: 0 10*3/uL (ref 0.0–0.1)
Basophils Relative: 1 %
Eosinophils Absolute: 0 10*3/uL (ref 0.0–0.5)
Eosinophils Relative: 0 %
HCT: 41.9 % (ref 36.0–46.0)
Hemoglobin: 13.9 g/dL (ref 12.0–15.0)
Immature Granulocytes: 0 %
Lymphocytes Relative: 21 %
Lymphs Abs: 0.9 10*3/uL (ref 0.7–4.0)
MCH: 26.6 pg (ref 26.0–34.0)
MCHC: 33.2 g/dL (ref 30.0–36.0)
MCV: 80.3 fL (ref 80.0–100.0)
Monocytes Absolute: 0.6 10*3/uL (ref 0.1–1.0)
Monocytes Relative: 16 %
Neutro Abs: 2.6 10*3/uL (ref 1.7–7.7)
Neutrophils Relative %: 62 %
Platelets: 223 10*3/uL (ref 150–400)
RBC: 5.22 MIL/uL — ABNORMAL HIGH (ref 3.87–5.11)
RDW: 14.4 % (ref 11.5–15.5)
WBC: 4.1 10*3/uL (ref 4.0–10.5)
nRBC: 0 % (ref 0.0–0.2)

## 2021-03-17 LAB — BASIC METABOLIC PANEL
Anion gap: 7 (ref 5–15)
BUN: 11 mg/dL (ref 6–20)
CO2: 26 mmol/L (ref 22–32)
Calcium: 8.6 mg/dL — ABNORMAL LOW (ref 8.9–10.3)
Chloride: 106 mmol/L (ref 98–111)
Creatinine, Ser: 0.73 mg/dL (ref 0.44–1.00)
GFR, Estimated: >60 mL/min (ref >60)
Glucose, Bld: 117 mg/dL — ABNORMAL HIGH (ref 70–99)
Potassium: 4 mmol/L (ref 3.5–5.1)
Sodium: 139 mmol/L (ref 135–145)

## 2021-03-17 LAB — POC URINE PREG, ED: Preg Test, Ur: NEGATIVE

## 2021-03-17 LAB — RESP PANEL BY RT-PCR (FLU A&B, COVID) ARPGX2
Influenza A by PCR: NEGATIVE
Influenza B by PCR: NEGATIVE
SARS Coronavirus 2 by RT PCR: POSITIVE — AB

## 2021-03-17 MED ORDER — HYDROCOD POLST-CPM POLST ER 10-8 MG/5ML PO SUER: 5.0000 mL | Freq: Two times a day (BID) | ORAL | 0 refills | Status: DC

## 2021-03-17 MED ORDER — IPRATROPIUM-ALBUTEROL 0.5-2.5 (3) MG/3ML IN SOLN
3.0000 mL | Freq: Once | RESPIRATORY_TRACT | Status: AC
  Administered 2021-03-17: 3 mL via RESPIRATORY_TRACT
  Filled 2021-03-17: qty 3

## 2021-03-17 MED ORDER — SODIUM CHLORIDE 0.9 % IV BOLUS
1000.0000 mL | Freq: Once | INTRAVENOUS | Status: AC
  Administered 2021-03-17: 1000 mL via INTRAVENOUS

## 2021-03-17 MED ORDER — ALBUTEROL SULFATE HFA 108 (90 BASE) MCG/ACT IN AERS: 2.0000 | INHALATION_SPRAY | Freq: Four times a day (QID) | RESPIRATORY_TRACT | 2 refills | Status: DC | PRN

## 2021-03-17 MED ORDER — KETOROLAC TROMETHAMINE 30 MG/ML IJ SOLN
30.0000 mg | Freq: Once | INTRAMUSCULAR | Status: AC
  Administered 2021-03-17: 30 mg via INTRAVENOUS
  Filled 2021-03-17: qty 1

## 2021-03-17 MED ORDER — VIRTUSSIN A/C 100-10 MG/5ML PO SOLN: 5.0000 mL | Freq: Three times a day (TID) | ORAL | 0 refills | Status: DC | PRN

## 2021-03-17 NOTE — ED Provider Notes (Signed)
[facility] Emergency Department Provider Note   ____________________________________________   None    (approximate)  I have reviewed the triage vital signs and the nursing notes.   HISTORY  Chief Complaint Covid Exposure and Shortness of Breath    HPI Lori Raymond is a 30 y.o. female patient complain shortness of breath and cough secondary to covert 19 exposure.  Patient has not taken vaccine.  Patient's son was positive COVID-19 earlier this week.  Patient stated positive home test yesterday.  Patient also stated loss of taste/smell and body aches.  Rates her pain/discomfort as a 9/10.  Described pain as "achy".  No palliative measure for complaint.  Patient was prescribed Paxlovid by PCP, but did not pick up the medication.      Past Medical History:  Diagnosis Date   Cardiomyopathy (HCC)    Depression    Endometriosis    IBS (irritable bowel syndrome)    Polycystic ovarian disease    PTSD (post-traumatic stress disorder)    Stroke (HCC)    Vitamin D deficiency     There are no problems to display for this patient.   Past Surgical History:  Procedure Laterality Date   TONSILLECTOMY      Prior to Admission medications   Medication Sig Start Date End Date Taking? Authorizing Provider  chlorpheniramine-HYDROcodone (TUSSIONEX PENNKINETIC ER) 10-8 MG/5ML SUER Take 5 mLs by mouth 2 (two) times daily. 03/17/21  Yes Joni Reining, PA-C  meloxicam (MOBIC) 15 MG tablet Take 1 tablet (15 mg total) by mouth daily. 03/02/19   Payton Mccallum, MD  predniSONE (STERAPRED UNI-PAK 21 TAB) 10 MG (21) TBPK tablet Take 6 tablets on the first day and decrease by 1 tablet each day until finished. 06/06/19   Triplett, Rulon Eisenmenger B, FNP    Allergies Lexapro [escitalopram oxalate], Benadryl [diphenhydramine], Latex, and Sulfa antibiotics  No family history on file.  Social History Social History   Tobacco Use   Smoking status: Never   Smokeless tobacco:  Never  Vaping Use   Vaping Use: Never used  Substance Use Topics   Alcohol use: Not Currently   Drug use: Yes    Types: Marijuana    Review of Systems Constitutional: No fever/chills Eyes: No visual changes. ENT: No sore throat. Cardiovascular: Denies chest pain.  History of cardiomyopathy Respiratory:  shortness of breath. Gastrointestinal: No abdominal pain.  No nausea, no vomiting.  No diarrhea.  No constipation. Genitourinary: Negative for dysuria. Musculoskeletal: Negative for back pain. Skin: Negative for rash. Neurological: Negative for headaches, focal weakness or numbness. Psychiatric: Anxiety, depression, and PTSD. Endocrine:  Hematological/Lymphatic:  Allergic/Immunilogical: Lexapro, latex, sulfa antibiotics, and Benadryl. ____________________________________________   PHYSICAL EXAM:  VITAL SIGNS: ED Triage Vitals  Enc Vitals Group     BP 03/17/21 0736 111/77     Pulse Rate 03/17/21 0736 96     Resp 03/17/21 0736 18     Temp 03/17/21 0736 100.2 F (37.9 C)     Temp Source 03/17/21 0736 Oral     SpO2 03/17/21 0736 97 %     Weight 03/17/21 0731 235 lb (106.6 kg)     Height 03/17/21 0731 5\' 2"  (1.575 m)     Head Circumference --      Peak Flow --      Pain Score 03/17/21 0731 9     Pain Loc --      Pain Edu? --      Excl. in GC? --  Constitutional: Anxious.  Alert and oriented.  Eyes: Conjunctivae are normal. PERRL. EOMI. Head: Atraumatic. Nose: No congestion/rhinnorhea. Mouth/Throat: Mucous membranes are moist.  Oropharynx non-erythematous. Neck: No stridor.   Hematological/Lymphatic/Immunilogical: No cervical lymphadenopathy. Cardiovascular: Normal rate, regular rhythm. Grossly normal heart sounds.  Good peripheral circulation. Respiratory: Normal respiratory effort.  No retractions. Lungs CTAB. Gastrointestinal: Soft and nontender.  Distention secondary to body habitus. No abdominal bruits. No CVA tenderness. Genitourinary:  Deferred Musculoskeletal: No lower extremity tenderness nor edema.  No joint effusions. Neurologic:  Normal speech and language. No gross focal neurologic deficits are appreciated. No gait instability. Skin:  Skin is warm, dry and intact. No rash noted. Psychiatric: Mood and affect are normal. Speech and behavior are normal.  ____________________________________________   LABS (all labs ordered are listed, but only abnormal results are displayed)  Labs Reviewed  RESP PANEL BY RT-PCR (FLU A&B, COVID) ARPGX2 - Abnormal; Notable for the following components:      Result Value   SARS Coronavirus 2 by RT PCR POSITIVE (*)    All other components within normal limits  CBC WITH DIFFERENTIAL/PLATELET - Abnormal; Notable for the following components:   RBC 5.22 (*)    All other components within normal limits  BASIC METABOLIC PANEL - Abnormal; Notable for the following components:   Glucose, Bld 117 (*)    Calcium 8.6 (*)    All other components within normal limits  URINALYSIS, COMPLETE (UACMP) WITH MICROSCOPIC - Abnormal; Notable for the following components:   Color, Urine YELLOW (*)    APPearance HAZY (*)    All other components within normal limits  POC URINE PREG, ED   ____________________________________________  EKG   ____________________________________________  RADIOLOGY I, Joni Reining, personally viewed and evaluated these images (plain radiographs) as part of my medical decision making, as well as reviewing the written report by the radiologist.  ED MD interpretation: Findings consistent with early pneumonia  Official radiology report(s): DG Chest Portable 1 View  Result Date: 03/17/2021 CLINICAL DATA:  Cough and shortness of breath.  COVID exposure. EXAM: PORTABLE CHEST 1 VIEW COMPARISON:  Chest x-ray dated October 03, 2019. FINDINGS: The patient is rotated to the left. The heart size and mediastinal contours are within normal limits. Normal pulmonary vascularity.  Subtle increased interstitial opacity in the mid to lower lungs. No focal consolidation, pleural effusion, or pneumothorax. No acute osseous abnormality. IMPRESSION: 1. Subtle increased interstitial opacity in the mid to lower lungs, consistent with COVID pneumonia given clinical history. Electronically Signed   By: Obie Dredge M.D.   On: 03/17/2021 08:32    ____________________________________________   PROCEDURES  Procedure(s) performed (including Critical Care):  Procedures   ____________________________________________   INITIAL IMPRESSION / ASSESSMENT AND PLAN / ED COURSE  As part of my medical decision making, I reviewed the following data within the electronic MEDICAL RECORD NUMBER         Patient presents with shortness of breath and body aches status post exposure to COVID-19.  Patient tested positive today for COVID-19.  Patient given discharge care instructions and advised to take medications as directed.  Follow-up with PCP.      ____________________________________________   FINAL CLINICAL IMPRESSION(S) / ED DIAGNOSES  Final diagnoses:  COVID-19     ED Discharge Orders          Ordered    chlorpheniramine-HYDROcodone (TUSSIONEX PENNKINETIC ER) 10-8 MG/5ML SUER  2 times daily        03/17/21 1026  Note:  This document was prepared using Dragon voice recognition software and may include unintentional dictation errors.    Joni Reining, PA-C 03/17/21 1029    Jene Every, MD 03/17/21 1135

## 2021-03-17 NOTE — ED Notes (Signed)
Oxygen sats after ambulation to bathroom 97%.

## 2021-03-17 NOTE — Discharge Instructions (Addendum)
Your chest x-ray findings are consistent with COVID-pneumonia.  Read and follow discharge care instructions.  Pick up your prescription for paxlovid.  Be advised Tussionex may cause drowsiness.  Advised Tylenol extra strength for fever/body aches.

## 2021-03-17 NOTE — ED Notes (Signed)
Tylenol taken at 0700

## 2021-03-17 NOTE — ED Triage Notes (Signed)
Pt states her son with sick with covid and she started having sx a couple of days ago with SOB and having bodyaches all over.

## 2021-03-22 ENCOUNTER — Other Ambulatory Visit: Payer: Self-pay

## 2021-03-22 ENCOUNTER — Ambulatory Visit
Admission: RE | Admit: 2021-03-22 | Discharge: 2021-03-22 | Disposition: A | Discharge status: Home / Self Care | Payer: Medicaid Other | Attending: Physician Assistant | Admitting: Physician Assistant

## 2021-03-22 ENCOUNTER — Ambulatory Visit
Admission: RE | Admit: 2021-03-22 | Discharge: 2021-03-22 | Disposition: A | Discharge status: Home / Self Care | Payer: Medicaid Other | Source: Ambulatory Visit | Attending: Physician Assistant | Admitting: Physician Assistant

## 2021-03-22 ENCOUNTER — Other Ambulatory Visit: Payer: Self-pay | Admitting: Physician Assistant

## 2021-03-22 DIAGNOSIS — J1282 Pneumonia due to coronavirus disease 2019: Secondary | ICD-10-CM

## 2021-03-22 DIAGNOSIS — U071 COVID-19: Secondary | ICD-10-CM | POA: Insufficient documentation

## 2021-03-23 ENCOUNTER — Emergency Department
Admission: EM | Admit: 2021-03-23 | Discharge: 2021-03-23 | Disposition: A | Discharge status: Home / Self Care | Payer: Medicaid Other | Attending: Emergency Medicine | Admitting: Emergency Medicine

## 2021-03-23 ENCOUNTER — Other Ambulatory Visit: Payer: Self-pay

## 2021-03-23 DIAGNOSIS — R0789 Other chest pain: Secondary | ICD-10-CM | POA: Insufficient documentation

## 2021-03-23 DIAGNOSIS — Z5321 Procedure and treatment not carried out due to patient leaving prior to being seen by health care provider: Secondary | ICD-10-CM | POA: Diagnosis not present

## 2021-03-23 DIAGNOSIS — R1012 Left upper quadrant pain: Secondary | ICD-10-CM | POA: Insufficient documentation

## 2021-03-23 LAB — CBC
HCT: 39.2 % (ref 36.0–46.0)
Hemoglobin: 13.2 g/dL (ref 12.0–15.0)
MCH: 27.3 pg (ref 26.0–34.0)
MCHC: 33.7 g/dL (ref 30.0–36.0)
MCV: 81 fL (ref 80.0–100.0)
Platelets: 286 10*3/uL (ref 150–400)
RBC: 4.84 MIL/uL (ref 3.87–5.11)
RDW: 13.7 % (ref 11.5–15.5)
WBC: 6.8 10*3/uL (ref 4.0–10.5)
nRBC: 0 % (ref 0.0–0.2)

## 2021-03-23 LAB — COMPREHENSIVE METABOLIC PANEL
ALT: 15 U/L (ref 0–44)
AST: 15 U/L (ref 15–41)
Albumin: 3.9 g/dL (ref 3.5–5.0)
Alkaline Phosphatase: 52 U/L (ref 38–126)
Anion gap: 6 (ref 5–15)
BUN: 14 mg/dL (ref 6–20)
CO2: 26 mmol/L (ref 22–32)
Calcium: 8.5 mg/dL — ABNORMAL LOW (ref 8.9–10.3)
Chloride: 107 mmol/L (ref 98–111)
Creatinine, Ser: 0.57 mg/dL (ref 0.44–1.00)
GFR, Estimated: >60 mL/min (ref >60)
Glucose, Bld: 94 mg/dL (ref 70–99)
Potassium: 4 mmol/L (ref 3.5–5.1)
Sodium: 139 mmol/L (ref 135–145)
Total Bilirubin: 0.5 mg/dL (ref 0.3–1.2)
Total Protein: 7 g/dL (ref 6.5–8.1)

## 2021-03-23 LAB — LIPASE, BLOOD: Lipase: 42 U/L (ref 11–51)

## 2021-03-23 LAB — TROPONIN I (HIGH SENSITIVITY): Troponin I (High Sensitivity): <2 ng/L (ref <18)

## 2021-03-23 NOTE — ED Triage Notes (Addendum)
Pt in with co LUQ pain and swelling states started when she was prescribed antivirals for Covid 6 days ago. Pt also co chest tightness, had xray done by pmd yesterday which was normal.

## 2021-03-24 ENCOUNTER — Other Ambulatory Visit: Payer: Self-pay

## 2021-03-24 ENCOUNTER — Emergency Department
Admission: EM | Admit: 2021-03-24 | Discharge: 2021-03-24 | Disposition: A | Discharge status: Home / Self Care | Payer: Medicaid Other | Attending: Emergency Medicine | Admitting: Emergency Medicine

## 2021-03-24 ENCOUNTER — Emergency Department: Payer: Medicaid Other

## 2021-03-24 ENCOUNTER — Encounter: Payer: Self-pay | Admitting: Emergency Medicine

## 2021-03-24 DIAGNOSIS — Z91040 Latex allergy status: Secondary | ICD-10-CM | POA: Diagnosis not present

## 2021-03-24 DIAGNOSIS — R1012 Left upper quadrant pain: Secondary | ICD-10-CM | POA: Insufficient documentation

## 2021-03-24 LAB — CBC
HCT: 40.6 % (ref 36.0–46.0)
Hemoglobin: 13.5 g/dL (ref 12.0–15.0)
MCH: 26.5 pg (ref 26.0–34.0)
MCHC: 33.3 g/dL (ref 30.0–36.0)
MCV: 79.8 fL — ABNORMAL LOW (ref 80.0–100.0)
Platelets: 260 10*3/uL (ref 150–400)
RBC: 5.09 MIL/uL (ref 3.87–5.11)
RDW: 13.8 % (ref 11.5–15.5)
WBC: 5.3 10*3/uL (ref 4.0–10.5)
nRBC: 0 % (ref 0.0–0.2)

## 2021-03-24 LAB — COMPREHENSIVE METABOLIC PANEL
ALT: 16 U/L (ref 0–44)
AST: 18 U/L (ref 15–41)
Albumin: 3.7 g/dL (ref 3.5–5.0)
Alkaline Phosphatase: 52 U/L (ref 38–126)
Anion gap: 7 (ref 5–15)
BUN: 15 mg/dL (ref 6–20)
CO2: 26 mmol/L (ref 22–32)
Calcium: 8.8 mg/dL — ABNORMAL LOW (ref 8.9–10.3)
Chloride: 107 mmol/L (ref 98–111)
Creatinine, Ser: 0.64 mg/dL (ref 0.44–1.00)
GFR, Estimated: >60 mL/min (ref >60)
Glucose, Bld: 129 mg/dL — ABNORMAL HIGH (ref 70–99)
Potassium: 3.5 mmol/L (ref 3.5–5.1)
Sodium: 140 mmol/L (ref 135–145)
Total Bilirubin: 0.5 mg/dL (ref 0.3–1.2)
Total Protein: 6.9 g/dL (ref 6.5–8.1)

## 2021-03-24 LAB — LIPASE, BLOOD: Lipase: 29 U/L (ref 11–51)

## 2021-03-24 LAB — TROPONIN I (HIGH SENSITIVITY): Troponin I (High Sensitivity): <2 ng/L (ref <18)

## 2021-03-24 MED ORDER — IOHEXOL 350 MG/ML SOLN
75.0000 mL | Freq: Once | INTRAVENOUS | Status: AC | PRN
  Administered 2021-03-24: 75 mL via INTRAVENOUS

## 2021-03-24 MED ORDER — NAPROXEN 375 MG PO TABS: 375.0000 mg | ORAL_TABLET | Freq: Two times a day (BID) | ORAL | 0 refills | Status: DC

## 2021-03-24 NOTE — ED Notes (Signed)
Reviewed discharge instructions, follow-up care, and prescriptions with patient. Patient verbalized understanding of all information reviewed. Patient stable, with no distress noted at this time.    

## 2021-03-24 NOTE — ED Triage Notes (Signed)
Pt comes into the ED via POV c/o LUQ pain that wraps around to her back.  Pt states she was prescribed antivirals for COVID 6 days ago.  Pt also admits to chest tightness, but completed a DG chest yesterday that was normal.  Pt currently has even and unlabored respirations at this time.

## 2021-03-24 NOTE — ED Provider Notes (Signed)
[facility] Emergency Department Provider Note   ____________________________________________    I have reviewed the triage vital signs and the nursing notes.   HISTORY  Chief Complaint Abdominal Pain     HPI Lori Raymond is a 30 y.o. female with history as noted below presents with complaints of left upper abdominal swelling and pain.  Patient reports she was diagnosed with COVID-19 pneumonia approximately 5 days ago, has completed a course of paxlovid.  Reviewed records demonstrates left-sided pneumonia noted on chest x-ray at the time of diagnosis.  Patient reports she is feeling improved still with some tightness to the left side of her chest and occasional cough.  Primarily she is concerned about swelling to her left upper abdomen which is developed over the last several days.  She reports the area is tender.  She denies injury to the area.  She is concerned could be related to her spleen  Past Medical History:  Diagnosis Date   Cardiomyopathy (HCC)    Depression    Endometriosis    IBS (irritable bowel syndrome)    Polycystic ovarian disease    PTSD (post-traumatic stress disorder)    Stroke (HCC)    Vitamin D deficiency     There are no problems to display for this patient.   Past Surgical History:  Procedure Laterality Date   TONSILLECTOMY      Prior to Admission medications   Medication Sig Start Date End Date Taking? Authorizing Provider  naproxen (NAPROSYN) 375 MG tablet Take 1 tablet (375 mg total) by mouth 2 (two) times daily with a meal. 03/24/21  Yes Jene Every, MD  albuterol (VENTOLIN HFA) 108 (90 Base) MCG/ACT inhaler Inhale 2 puffs into the lungs every 6 (six) hours as needed for wheezing or shortness of breath. 03/17/21   Joni Reining, PA-C  guaiFENesin-codeine (VIRTUSSIN A/C) 100-10 MG/5ML syrup Take 5 mLs by mouth 3 (three) times daily as needed for cough. 03/17/21   Joni Reining, PA-C  meloxicam (MOBIC) 15 MG  tablet Take 1 tablet (15 mg total) by mouth daily. 03/02/19   Payton Mccallum, MD  predniSONE (STERAPRED UNI-PAK 21 TAB) 10 MG (21) TBPK tablet Take 6 tablets on the first day and decrease by 1 tablet each day until finished. 06/06/19   Triplett, Rulon Eisenmenger B, FNP     Allergies Lexapro [escitalopram oxalate], Benadryl [diphenhydramine], Latex, and Sulfa antibiotics  History reviewed. No pertinent family history.  Social History Social History   Tobacco Use   Smoking status: Never   Smokeless tobacco: Never  Vaping Use   Vaping Use: Never used  Substance Use Topics   Alcohol use: Not Currently   Drug use: Yes    Types: Marijuana    Review of Systems  Constitutional: No fever/chills Eyes: No visual changes.  ENT: No sore throat. Cardiovascular: As above Respiratory: Denies shortness of breath.  Positive cough Gastrointestinal: As above Genitourinary: Negative for dysuria. Musculoskeletal: Negative for back pain. Skin: Negative for rash. Neurological: Negative for headaches    ____________________________________________   PHYSICAL EXAM:  VITAL SIGNS: ED Triage Vitals  Enc Vitals Group     BP 03/24/21 0800 104/63     Pulse --      Resp 03/24/21 0800 18     Temp --      Temp src --      SpO2 --      Weight 03/24/21 0742 106 kg (233 lb 11 oz)     Height  03/24/21 0742 1.575 m (5\' 2" )     Head Circumference --      Peak Flow --      Pain Score 03/24/21 0742 7     Pain Loc --      Pain Edu? --      Excl. in GC? --     Constitutional: Alert and oriented. No acute distress.   Nose: No congestion/rhinnorhea. Mouth/Throat: Mucous membranes are moist.   Neck:  Painless ROM Cardiovascular: Normal rate, regular rhythm. Grossly normal heart sounds.  Good peripheral circulation. Respiratory: Normal respiratory effort.  No retractions. Lungs CTAB. Gastrointestinal: Subtle bulging to the left upper abdomen, with some faint bluish discoloration, tender to palpation, appears to  lie anterior to the lower ribs  Musculoskeletal: No lower extremity tenderness nor edema.  Warm and well perfused Neurologic:  Normal speech and language. No gross focal neurologic deficits are appreciated.  Skin:  Skin is warm, dry and intact. No rash noted. Psychiatric: Mood and affect are normal. Speech and behavior are normal.  ____________________________________________   LABS (all labs ordered are listed, but only abnormal results are displayed)  Labs Reviewed  CBC - Abnormal; Notable for the following components:      Result Value   MCV 79.8 (*)    All other components within normal limits  COMPREHENSIVE METABOLIC PANEL - Abnormal; Notable for the following components:   Glucose, Bld 129 (*)    Calcium 8.8 (*)    All other components within normal limits  LIPASE, BLOOD   ____________________________________________  EKG  ED ECG REPORT I, 03/26/21, the attending physician, personally viewed and interpreted this ECG.  Date: 03/24/2021  Rhythm: normal sinus rhythm QRS Axis: normal Intervals: normal ST/T Wave abnormalities: normal Narrative Interpretation: no evidence of acute ischemia  ____________________________________________  RADIOLOGY  CT abdomen pelvis reviewed by me, no acute abnormality ____________________________________________   PROCEDURES  Procedure(s) performed: No  Procedures   Critical Care performed: No ____________________________________________   INITIAL IMPRESSION / ASSESSMENT AND PLAN / ED COURSE  Pertinent labs & imaging results that were available during my care of the patient were reviewed by me and considered in my medical decision making (see chart for details).   Patient with recent diagnosis of COVID-19 pneumonia, appears to be improving however now with left upper abdomen swelling, question possible subcutaneous hematoma not consistent with abscess, doubt splenomegaly but imaging required to further evaluate.  Will  send for CT abdomen pelvis  CT scan is unremarkable, lab work is quite reassuring.  Patient is reassured, appropriate discharge with outpatient follow-up    ____________________________________________   FINAL CLINICAL IMPRESSION(S) / ED DIAGNOSES  Final diagnoses:  Left upper quadrant abdominal pain        Note:  This document was prepared using Dragon voice recognition software and may include unintentional dictation errors.    03/26/2021, MD 03/24/21 [phone]

## 2021-05-23 ENCOUNTER — Encounter: Payer: Self-pay | Admitting: Emergency Medicine

## 2021-05-23 ENCOUNTER — Emergency Department: Payer: Medicaid Other

## 2021-05-23 ENCOUNTER — Other Ambulatory Visit: Payer: Self-pay

## 2021-05-23 DIAGNOSIS — R1110 Vomiting, unspecified: Secondary | ICD-10-CM | POA: Insufficient documentation

## 2021-05-23 DIAGNOSIS — Z5321 Procedure and treatment not carried out due to patient leaving prior to being seen by health care provider: Secondary | ICD-10-CM | POA: Insufficient documentation

## 2021-05-23 DIAGNOSIS — R197 Diarrhea, unspecified: Secondary | ICD-10-CM | POA: Diagnosis not present

## 2021-05-23 DIAGNOSIS — R102 Pelvic and perineal pain: Secondary | ICD-10-CM | POA: Insufficient documentation

## 2021-05-23 LAB — COMPREHENSIVE METABOLIC PANEL
ALT: 17 U/L (ref 0–44)
AST: 15 U/L (ref 15–41)
Albumin: 4.2 g/dL (ref 3.5–5.0)
Alkaline Phosphatase: 67 U/L (ref 38–126)
Anion gap: 6 (ref 5–15)
BUN: 18 mg/dL (ref 6–20)
CO2: 28 mmol/L (ref 22–32)
Calcium: 8.8 mg/dL — ABNORMAL LOW (ref 8.9–10.3)
Chloride: 105 mmol/L (ref 98–111)
Creatinine, Ser: 0.57 mg/dL (ref 0.44–1.00)
GFR, Estimated: >60 mL/min (ref >60)
Glucose, Bld: 97 mg/dL (ref 70–99)
Potassium: 3.9 mmol/L (ref 3.5–5.1)
Sodium: 139 mmol/L (ref 135–145)
Total Bilirubin: 0.3 mg/dL (ref 0.3–1.2)
Total Protein: 7.6 g/dL (ref 6.5–8.1)

## 2021-05-23 LAB — URINALYSIS, ROUTINE W REFLEX MICROSCOPIC
Bilirubin Urine: NEGATIVE
Glucose, UA: NEGATIVE mg/dL
Hgb urine dipstick: NEGATIVE
Ketones, ur: NEGATIVE mg/dL
Leukocytes,Ua: NEGATIVE
Nitrite: NEGATIVE
Protein, ur: NEGATIVE mg/dL
Specific Gravity, Urine: 1.018 (ref 1.005–1.030)
pH: 7 (ref 5.0–8.0)

## 2021-05-23 LAB — CBC
HCT: 40.4 % (ref 36.0–46.0)
Hemoglobin: 13.2 g/dL (ref 12.0–15.0)
MCH: 26.7 pg (ref 26.0–34.0)
MCHC: 32.7 g/dL (ref 30.0–36.0)
MCV: 81.8 fL (ref 80.0–100.0)
Platelets: 308 10*3/uL (ref 150–400)
RBC: 4.94 MIL/uL (ref 3.87–5.11)
RDW: 13.7 % (ref 11.5–15.5)
WBC: 8 10*3/uL (ref 4.0–10.5)
nRBC: 0 % (ref 0.0–0.2)

## 2021-05-23 LAB — POC URINE PREG, ED: Preg Test, Ur: NEGATIVE

## 2021-05-23 LAB — LIPASE, BLOOD: Lipase: 32 U/L (ref 11–51)

## 2021-05-23 MED ORDER — IOHEXOL 300 MG/ML  SOLN
100.0000 mL | Freq: Once | INTRAMUSCULAR | Status: AC | PRN
  Administered 2021-05-23: 100 mL via INTRAVENOUS

## 2021-05-23 NOTE — ED Triage Notes (Signed)
Pt in with what is described as "bilateral ovary" cramping, and stabbing pain above umbilicus, onset 6 days ago. States she is 8 mo postpartum, and had IUD placed 6 mo's ago - feels like it is contributing to her symptoms today. She is having increased clear vaginal discharge, and bloating to her abdomen. Reports some emesis and diarrhea past few days

## 2021-05-24 ENCOUNTER — Emergency Department: Payer: Medicaid Other

## 2021-05-24 ENCOUNTER — Emergency Department
Admission: EM | Admit: 2021-05-24 | Discharge: 2021-05-24 | Disposition: A | Discharge status: Home / Self Care | Payer: Medicaid Other | Attending: Emergency Medicine | Admitting: Emergency Medicine

## 2021-05-24 DIAGNOSIS — R102 Pelvic and perineal pain: Secondary | ICD-10-CM

## 2021-05-26 ENCOUNTER — Telehealth: Payer: Medicaid Other | Admitting: Emergency Medicine

## 2021-05-26 DIAGNOSIS — A6000 Herpesviral infection of urogenital system, unspecified: Secondary | ICD-10-CM

## 2021-05-26 MED ORDER — LIDOCAINE 5 % EX OINT: 1.0000 "application " | TOPICAL_OINTMENT | Freq: Three times a day (TID) | CUTANEOUS | 0 refills | Status: DC | PRN

## 2021-05-26 NOTE — Patient Instructions (Signed)
  Lori Raymond, thank you for joining Cathlyn Parsons, NP for today's virtual visit.  While this provider is not your primary care provider (PCP), if your PCP is located in our provider database this encounter information will be shared with them immediately following your visit.  Consent: (Patient) Lori Raymond provided verbal consent for this virtual visit at the beginning of the encounter.  Current Medications:  Current Outpatient Medications:    lidocaine (XYLOCAINE) 5 % ointment, Apply 1 application topically 3 (three) times daily as needed., Disp: 35.44 g, Rfl: 0   albuterol (VENTOLIN HFA) 108 (90 Base) MCG/ACT inhaler, Inhale 2 puffs into the lungs every 6 (six) hours as needed for wheezing or shortness of breath., Disp: 1 each, Rfl: 2   guaiFENesin-codeine (VIRTUSSIN A/C) 100-10 MG/5ML syrup, Take 5 mLs by mouth 3 (three) times daily as needed for cough., Disp: 120 mL, Rfl: 0   meloxicam (MOBIC) 15 MG tablet, Take 1 tablet (15 mg total) by mouth daily., Disp: 30 tablet, Rfl: 0   naproxen (NAPROSYN) 375 MG tablet, Take 1 tablet (375 mg total) by mouth 2 (two) times daily with a meal., Disp: 20 tablet, Rfl: 0   predniSONE (STERAPRED UNI-PAK 21 TAB) 10 MG (21) TBPK tablet, Take 6 tablets on the first day and decrease by 1 tablet each day until finished., Disp: 21 tablet, Rfl: 0   Medications ordered in this encounter:  Meds ordered this encounter  Medications   lidocaine (XYLOCAINE) 5 % ointment    Sig: Apply 1 application topically 3 (three) times daily as needed.    Dispense:  35.44 g    Refill:  0     *If you need refills on other medications prior to your next appointment, please contact your pharmacy*  Follow-Up: Call back or seek an in-person evaluation if the symptoms worsen or if the condition fails to improve as anticipated.  Other Instructions Continue taking the valcyclovir as prescribed. Talk with your primary care provider about whether you need suppressive  therapy for awhile.    If you have been instructed to have an in-person evaluation today at a local Urgent Care facility, please use the link below. It will take you to a list of all of our available [facility], including address, phone number and hours of operation. Please do not delay care.  [facility]  If you or a family member do not have a primary care provider, use the link below to schedule a visit and establish care. When you choose a [facility] primary care physician or advanced practice provider, you gain a long-term partner in health. Find a Primary Care Provider  Learn more about [facility]'s in-office and virtual care options: [facility] - Get Care Now

## 2021-05-26 NOTE — Progress Notes (Signed)
Virtual Visit Consent   Lori Raymond, you are scheduled for a virtual visit with a [facility] provider today.     Just as with appointments in the office, your consent must be obtained to participate.  Your consent will be active for this visit and any virtual visit you may have with one of our providers in the next 365 days.     If you have a MyChart account, a copy of this consent can be sent to you electronically.  All virtual visits are billed to your insurance company just like a traditional visit in the office.    As this is a virtual visit, video technology does not allow for your provider to perform a traditional examination.  This may limit your provider's ability to fully assess your condition.  If your provider identifies any concerns that need to be evaluated in person or the need to arrange testing (such as labs, EKG, etc.), we will make arrangements to do so.     Although advances in technology are sophisticated, we cannot ensure that it will always work on either your end or our end.  If the connection with a video visit is poor, the visit may have to be switched to a telephone visit.  With either a video or telephone visit, we are not always able to ensure that we have a secure connection.     I need to obtain your verbal consent now.   Are you willing to proceed with your visit today?    Lori Raymond has provided verbal consent on 05/26/2021 for a virtual visit (video or telephone).   Lori Parsons, NP   Date: 05/26/2021 3:44 PM   Virtual Visit via Video Note   I, Lori Raymond, connected with  Lori Raymond  ([id number], [birth year]) on 05/26/21 at  3:30 PM EST by a video-enabled telemedicine application and verified that I am speaking with the correct person using two identifiers.  Audio was not working on the video visit so we converted this to a telephone call.  Location: Patient: Virtual Visit Location Patient: Home Provider: Virtual Visit Location  Provider: Home Office   I discussed the limitations of evaluation and management by telemedicine and the availability of in person appointments. The patient expressed understanding and agreed to proceed.    History of Present Illness: Lori Raymond is a 30 y.o. who identifies as a female who was assigned female at birth, and is being seen today for a herpes recurrence. Blisters that are very painful and itchy are located superior to her anus, an  unusual location for an outbreak for her.  Symptoms started 2 days ago.  Patient has been taking her prescription for valacyclovir for herpes recurrence without improvement or relief of symptoms.  She reports she typically has 1 outbreak a year.  She has a child who is 36 months old and she was on suppressive therapy during her pregnancy.  HPI: HPI  Problems:  Patient Active Problem List   Diagnosis Date Noted   Herpes genitalia 05/26/2021    Allergies:  Allergies  Allergen Reactions   Lexapro [Escitalopram Oxalate] Anaphylaxis   Benadryl [Diphenhydramine] Palpitations   Latex Rash   Sulfa Antibiotics Rash   Medications:  Current Outpatient Medications:    lidocaine (XYLOCAINE) 5 % ointment, Apply 1 application topically 3 (three) times daily as needed., Disp: 35.44 g, Rfl: 0   albuterol (VENTOLIN HFA) 108 (90 Base) MCG/ACT inhaler, Inhale 2 puffs into the lungs every 6 (  six) hours as needed for wheezing or shortness of breath., Disp: 1 each, Rfl: 2   guaiFENesin-codeine (VIRTUSSIN A/C) 100-10 MG/5ML syrup, Take 5 mLs by mouth 3 (three) times daily as needed for cough., Disp: 120 mL, Rfl: 0   meloxicam (MOBIC) 15 MG tablet, Take 1 tablet (15 mg total) by mouth daily., Disp: 30 tablet, Rfl: 0   naproxen (NAPROSYN) 375 MG tablet, Take 1 tablet (375 mg total) by mouth 2 (two) times daily with a meal., Disp: 20 tablet, Rfl: 0   predniSONE (STERAPRED UNI-PAK 21 TAB) 10 MG (21) TBPK tablet, Take 6 tablets on the first day and decrease by 1 tablet each  day until finished., Disp: 21 tablet, Rfl: 0  Observations/Objective: Patient is well-developed, well-nourished in no acute distress.  Resting comfortably  at home.  Head is normocephalic, atraumatic.  No labored breathing.  Speech is clear and coherent with logical content.  Patient is alert and oriented at baseline.   Psych pain minimal   Assessment and Plan: 1. Genital herpes simplex, unspecified site  I recommended Tylenol or ibuprofen for pain and prescribed lidocaine 5% ointment to help treat pain.  Encourage patient to discuss with her PCP whether or not she needs suppressive therapy  Follow Up Instructions: I discussed the assessment and treatment plan with the patient. The patient was provided an opportunity to ask questions and all were answered. The patient agreed with the plan and demonstrated an understanding of the instructions.  A copy of instructions were sent to the patient via MyChart unless otherwise noted below.    The patient was advised to call back or seek an in-person evaluation if the symptoms worsen or if the condition fails to improve as anticipated.  Time:  I spent 10 minutes with the patient via telehealth technology discussing the above problems/concerns.    Lori Parsons, NP

## 2021-06-28 ENCOUNTER — Other Ambulatory Visit: Payer: Self-pay

## 2021-06-28 ENCOUNTER — Emergency Department
Admission: EM | Admit: 2021-06-28 | Discharge: 2021-06-28 | Disposition: A | Discharge status: Home / Self Care | Payer: Medicaid Other | Attending: Emergency Medicine | Admitting: Emergency Medicine

## 2021-06-28 ENCOUNTER — Emergency Department: Payer: Medicaid Other

## 2021-06-28 DIAGNOSIS — M542 Cervicalgia: Secondary | ICD-10-CM | POA: Insufficient documentation

## 2021-06-28 DIAGNOSIS — M25512 Pain in left shoulder: Secondary | ICD-10-CM | POA: Diagnosis present

## 2021-06-28 DIAGNOSIS — Z20822 Contact with and (suspected) exposure to covid-19: Secondary | ICD-10-CM | POA: Diagnosis not present

## 2021-06-28 DIAGNOSIS — R0602 Shortness of breath: Secondary | ICD-10-CM | POA: Insufficient documentation

## 2021-06-28 DIAGNOSIS — R0789 Other chest pain: Secondary | ICD-10-CM | POA: Diagnosis not present

## 2021-06-28 DIAGNOSIS — Z91040 Latex allergy status: Secondary | ICD-10-CM | POA: Diagnosis not present

## 2021-06-28 LAB — COMPREHENSIVE METABOLIC PANEL
ALT: 17 U/L (ref 0–44)
AST: 15 U/L (ref 15–41)
Albumin: 4.1 g/dL (ref 3.5–5.0)
Alkaline Phosphatase: 64 U/L (ref 38–126)
Anion gap: 6 (ref 5–15)
BUN: 16 mg/dL (ref 6–20)
CO2: 26 mmol/L (ref 22–32)
Calcium: 8.8 mg/dL — ABNORMAL LOW (ref 8.9–10.3)
Chloride: 107 mmol/L (ref 98–111)
Creatinine, Ser: 0.56 mg/dL (ref 0.44–1.00)
GFR, Estimated: >60 mL/min (ref >60)
Glucose, Bld: 99 mg/dL (ref 70–99)
Potassium: 3.9 mmol/L (ref 3.5–5.1)
Sodium: 139 mmol/L (ref 135–145)
Total Bilirubin: 0.4 mg/dL (ref 0.3–1.2)
Total Protein: 7.3 g/dL (ref 6.5–8.1)

## 2021-06-28 LAB — CBC WITH DIFFERENTIAL/PLATELET
Abs Immature Granulocytes: 0.03 10*3/uL (ref 0.00–0.07)
Basophils Absolute: 0 10*3/uL (ref 0.0–0.1)
Basophils Relative: 0 %
Eosinophils Absolute: 0.1 10*3/uL (ref 0.0–0.5)
Eosinophils Relative: 1 %
HCT: 42 % (ref 36.0–46.0)
Hemoglobin: 13.6 g/dL (ref 12.0–15.0)
Immature Granulocytes: 0 %
Lymphocytes Relative: 27 %
Lymphs Abs: 2 10*3/uL (ref 0.7–4.0)
MCH: 26.3 pg (ref 26.0–34.0)
MCHC: 32.4 g/dL (ref 30.0–36.0)
MCV: 81.1 fL (ref 80.0–100.0)
Monocytes Absolute: 0.5 10*3/uL (ref 0.1–1.0)
Monocytes Relative: 6 %
Neutro Abs: 4.9 10*3/uL (ref 1.7–7.7)
Neutrophils Relative %: 66 %
Platelets: 268 10*3/uL (ref 150–400)
RBC: 5.18 MIL/uL — ABNORMAL HIGH (ref 3.87–5.11)
RDW: 13.8 % (ref 11.5–15.5)
WBC: 7.5 10*3/uL (ref 4.0–10.5)
nRBC: 0 % (ref 0.0–0.2)

## 2021-06-28 LAB — TROPONIN I (HIGH SENSITIVITY)
Troponin I (High Sensitivity): 3 ng/L (ref <18)
Troponin I (High Sensitivity): 2 ng/L (ref <18)

## 2021-06-28 LAB — RESP PANEL BY RT-PCR (FLU A&B, COVID) ARPGX2
Influenza A by PCR: NEGATIVE
Influenza B by PCR: NEGATIVE
SARS Coronavirus 2 by RT PCR: NEGATIVE

## 2021-06-28 MED ORDER — LIDOCAINE 5 % EX PTCH: 1.0000 | MEDICATED_PATCH | Freq: Two times a day (BID) | CUTANEOUS | 0 refills | Status: DC

## 2021-06-28 NOTE — ED Notes (Signed)
AAOx3.  Skin warm and dry.  STates has history of heart failure and cardiomyopathy from the flu.  Patient is anxious about cardiac status.  Sent to ED from Urgent Care for cardiac workup.  Reassurance given with minimal effect.  Patient placed on cardiac monitor.  AAOx3.  Skin warm and dry. NAD

## 2021-06-28 NOTE — ED Provider Notes (Signed)
[facility] Emergency Department Provider Note ____________________________________________   Event Date/Time   First MD Initiated Contact with Patient 06/28/21 1429     (approximate)  I have reviewed the triage vital signs and the nursing notes.  HISTORY  Chief Complaint Shortness of Breath and Tingling   HPI Lori Raymond is a 30 y.o. femalewho presents to the ED for evaluation of SOB and left shoulder pain.   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  Follows with [facility] cardiology. No longer on diuretics.  Morbid obesity.  Patient presents to the ED for the evaluation of 3 to 4 days of constant left-sided neck, shoulder and chest pain.  She reports pain to the left side of her neck primarily, radiating down her anterior chest to her left flank.  She reports sharp chest pain that has not resolved.  She has not tried any medications or interventions.  She reports tolerating p.o. intake and toileting at her baseline.  She is quite explicitly concerned that this is recurrence of her heart failure and is requesting an emergent echo.  Further concerned that this might be a heart attack.  She denies any shortness of breath, cough, fever, trauma, syncope or falls, rash, nipple discharge or breast swelling.  Past Medical History:  Diagnosis Date   Cardiomyopathy (HCC)    Depression    Endometriosis    IBS (irritable bowel syndrome)    Polycystic ovarian disease    PTSD (post-traumatic stress disorder)    Stroke [facility])    Vitamin D deficiency     Patient Active Problem List   Diagnosis Date Noted   Herpes genitalia 05/26/2021    Past Surgical History:  Procedure Laterality Date   TONSILLECTOMY      Prior to Admission medications   Medication Sig Start Date End Date Taking? Authorizing Provider  lidocaine (LIDODERM) 5 % Place 1 patch onto the skin every 12 (twelve) hours. Remove & Discard  patch within 12 hours or as directed by MD 06/28/21 06/28/22 Yes Delton Prairie, MD  albuterol (VENTOLIN HFA) 108 (90 Base) MCG/ACT inhaler Inhale 2 puffs into the lungs every 6 (six) hours as needed for wheezing or shortness of breath. 03/17/21   Joni Reining, PA-C  guaiFENesin-codeine (VIRTUSSIN A/C) 100-10 MG/5ML syrup Take 5 mLs by mouth 3 (three) times daily as needed for cough. 03/17/21   Joni Reining, PA-C  lidocaine (XYLOCAINE) 5 % ointment Apply 1 application topically 3 (three) times daily as needed. 05/26/21   Cathlyn Parsons, NP  meloxicam (MOBIC) 15 MG tablet Take 1 tablet (15 mg total) by mouth daily. 03/02/19   Payton Mccallum, MD  naproxen (NAPROSYN) 375 MG tablet Take 1 tablet (375 mg total) by mouth 2 (two) times daily with a meal. 03/24/21   Jene Every, MD  predniSONE (STERAPRED UNI-PAK 21 TAB) 10 MG (21) TBPK tablet Take 6 tablets on the first day and decrease by 1 tablet each day until finished. 06/06/19   Triplett, Rulon Eisenmenger B, FNP    Allergies Lexapro [escitalopram oxalate], Benadryl [diphenhydramine], Latex, and Sulfa antibiotics  No family history on file.  Social History Social History   Tobacco Use   Smoking status: Never   Smokeless tobacco: Never  Vaping Use   Vaping Use: Never used  Substance Use Topics   Alcohol use: Not Currently   Drug use: Yes    Types: Marijuana    Review of Systems  Constitutional: No  fever/chills Eyes: No visual changes. ENT: No sore throat. Cardiovascular: Positive for chest pain. Respiratory: Denies shortness of breath. Gastrointestinal: No abdominal pain.  No nausea, no vomiting.  No diarrhea.  No constipation. Genitourinary: Negative for dysuria. Musculoskeletal: Negative for back pain. Positive for atraumatic left neck, left chest and flank pain. Skin: Negative for rash. Neurological: Negative for headaches, focal weakness or numbness.  ____________________________________________   PHYSICAL EXAM:  VITAL  SIGNS: Vitals:   06/28/21 1233  BP: 130/66  Pulse: 64  Resp: 20  Temp: 98.4 F (36.9 C)  SpO2: 96%      Constitutional: Alert and oriented. Well appearing and in no acute distress.  Morbidly obese. Eyes: Conjunctivae are normal. PERRL. EOMI. Head: Atraumatic. Nose: No congestion/rhinnorhea. Mouth/Throat: Mucous membranes are moist.  Oropharynx non-erythematous. Neck: No stridor. No cervical spine tenderness to palpation. Cardiovascular: Normal rate, regular rhythm. Good peripheral circulation. Respiratory: Normal respiratory effort.  No retractions. Lungs CTAB. Gastrointestinal: Soft , nondistended, nontender to palpation.  Musculoskeletal: No joint effusions. No signs of acute trauma. No skin changes, rash to the areas of pain along her left-sided sternocleidomastoid musculature, left anterior chest wall and left lateral flank. Mild tenderness to palpation.  No induration, erythema, warmth or fluctuance in any of these sites. Left arm is distally neurovascularly intact.  No impairment of motion.  No pain with ranging. Neurologic:  Normal speech and language. No gross focal neurologic deficits are appreciated. No gait instability noted. Skin:  Skin is warm, dry and intact. No rash noted. Psychiatric: Mood and affect are normal. Speech and behavior are normal. ____________________________________________   LABS (all labs ordered are listed, but only abnormal results are displayed)  Labs Reviewed  COMPREHENSIVE METABOLIC PANEL - Abnormal; Notable for the following components:      Result Value   Calcium 8.8 (*)    All other components within normal limits  CBC WITH DIFFERENTIAL/PLATELET - Abnormal; Notable for the following components:   RBC 5.18 (*)    All other components within normal limits  RESP PANEL BY RT-PCR (FLU A&B, COVID) ARPGX2  TROPONIN I (HIGH SENSITIVITY)  TROPONIN I (HIGH SENSITIVITY)   ____________________________________________  12 Lead EKG  Sinus  rhythm, rate of 75 bpm.  Rightward axis and normal intervals.  No STEMI. Similar to EKG from September ____________________________________________  RADIOLOGY  ED MD interpretation:  CXR reviewed by me without evidence of acute cardiopulmonary pathology.  Official radiology report(s): DG Chest 2 View  Result Date: 06/28/2021 CLINICAL DATA:  Chest, neck, and left arm pain. EXAM: CHEST - 2 VIEW COMPARISON:  03/22/2021 FINDINGS: The cardiomediastinal silhouette is unchanged with normal heart size. The lungs are well inflated and clear. No pleural effusion or pneumothorax is identified. No acute osseous abnormality is seen. IMPRESSION: No active cardiopulmonary disease. Electronically Signed   By: Sebastian Ache M.D.   On: 06/28/2021 13:26    ____________________________________________   PROCEDURES and INTERVENTIONS  Procedure(s) performed (including Critical Care):  Procedures  Medications - No data to display  ____________________________________________   MDM / ED COURSE   Morbidly obese 30 year old female presents to the ED with a few days of vague left-sided neck and chest pain, without evidence of acute pathology and suitable for outpatient management.  Vitals are normal, EKG is nonischemic and 2 troponins are negative.  CXR without signs of PTX, mass or infiltrate.  Blood work is benign.  She PERCS out and I see no indications for further diagnostics.  I reassured her that I see no  signs of decompensated heart failure or heart attack, but she does not quite believe me and requested emergent echo which I would not provide as this is not appropriate.  We discussed following up with her cardiologist and return precautions for the ED.     ____________________________________________   FINAL CLINICAL IMPRESSION(S) / ED DIAGNOSES  Final diagnoses:  Other chest pain     ED Discharge Orders          Ordered    lidocaine (LIDODERM) 5 %  Every 12 hours        06/28/21 1547              Deryl Ports Katrinka Blazing   Note:  This document was prepared using Conservation officer, historic buildings and may include unintentional dictation errors.    Delton Prairie, MD 06/28/21 [phone]

## 2021-06-28 NOTE — ED Provider Notes (Signed)
°  Emergency Medicine Provider Triage Evaluation Note  Lori Raymond , a 30 y.o.female,  was evaluated in triage.  Pt complains of chest pain x 3 days.  Patient states it is worse this morning, with radiation to her jaw and left shoulder.  Reports shortness of breath as well.  Denies fever/chills, abdominal pain, back pain, flank pain, or urinary symptoms.  Received aspirin at urgent care   Review of Systems  Positive: Chest pain, shortness of breath Negative: Denies fever, abdominal pain, vomiting  Physical Exam   Vitals:   06/28/21 1233  BP: 130/66  Pulse: 64  Resp: 20  Temp: 98.4 F (36.9 C)  SpO2: 96%   Gen:   Awake, no distress   Resp:  Normal effort  MSK:   Moves extremities without difficulty  Other:    Medical Decision Making  Given the patient's initial medical screening exam, the following diagnostic evaluation has been ordered. The patient will be placed in the appropriate treatment space, once one is available, to complete the evaluation and treatment. I have discussed the plan of care with the patient and I have advised the patient that an ED physician or mid-level practitioner will reevaluate their condition after the test results have been received, as the results may give them additional insight into the type of treatment they may need.    Diagnostics: Labs, CXR, EKG  Treatments: none immediately   Varney Daily, [place] 06/28/21 1240    Delton Prairie, MD 06/28/21 [phone]

## 2021-06-28 NOTE — Discharge Instructions (Signed)
Please take Tylenol and ibuprofen/Advil for your pain.  It is safe to take them together, or to alternate them every few hours.  Take up to 1000mg of Tylenol at a time, up to 4 times per day.  Do not take more than 4000 mg of Tylenol in 24 hours.  For ibuprofen, take 400-600 mg, 4-5 times per day.  Please use lidocaine patches and your site of pain.  Apply 1 patch at a time, leave on for 12 hours, then remove for 12 hours.  12 hours on, 12 hours off.  Do not apply more than 1 patch at a time.  

## 2021-06-28 NOTE — ED Triage Notes (Signed)
Pt here with neck pain extending down to her left arm. Pt states that pain is severe and she feels like someone is choking her on one side. Pt in NAD otherwise.

## 2021-07-26 ENCOUNTER — Other Ambulatory Visit: Payer: Self-pay | Admitting: Physician Assistant

## 2021-07-26 DIAGNOSIS — R222 Localized swelling, mass and lump, trunk: Secondary | ICD-10-CM

## 2021-07-31 ENCOUNTER — Ambulatory Visit
Admission: RE | Admit: 2021-07-31 | Discharge: 2021-07-31 | Disposition: A | Discharge status: Home / Self Care | Payer: Medicaid Other | Source: Ambulatory Visit | Attending: Physician Assistant | Admitting: Physician Assistant

## 2021-07-31 ENCOUNTER — Other Ambulatory Visit: Payer: Self-pay | Admitting: Physician Assistant

## 2021-07-31 ENCOUNTER — Other Ambulatory Visit: Payer: Self-pay

## 2021-07-31 DIAGNOSIS — R222 Localized swelling, mass and lump, trunk: Secondary | ICD-10-CM

## 2021-08-03 DIAGNOSIS — R161 Splenomegaly, not elsewhere classified: Secondary | ICD-10-CM | POA: Insufficient documentation

## 2021-08-12 ENCOUNTER — Other Ambulatory Visit: Payer: Self-pay

## 2021-08-12 ENCOUNTER — Emergency Department: Payer: Medicaid Other

## 2021-08-12 ENCOUNTER — Encounter: Payer: Self-pay | Admitting: Emergency Medicine

## 2021-08-12 ENCOUNTER — Emergency Department
Admission: EM | Admit: 2021-08-12 | Discharge: 2021-08-12 | Disposition: A | Discharge status: Home / Self Care | Payer: Medicaid Other | Attending: Emergency Medicine | Admitting: Emergency Medicine

## 2021-08-12 DIAGNOSIS — N390 Urinary tract infection, site not specified: Secondary | ICD-10-CM | POA: Insufficient documentation

## 2021-08-12 DIAGNOSIS — R531 Weakness: Secondary | ICD-10-CM | POA: Diagnosis not present

## 2021-08-12 DIAGNOSIS — R1012 Left upper quadrant pain: Secondary | ICD-10-CM | POA: Diagnosis present

## 2021-08-12 DIAGNOSIS — M79601 Pain in right arm: Secondary | ICD-10-CM | POA: Insufficient documentation

## 2021-08-12 DIAGNOSIS — M79602 Pain in left arm: Secondary | ICD-10-CM | POA: Diagnosis not present

## 2021-08-12 HISTORY — DX: Herpesviral infection, unspecified: B00.9

## 2021-08-12 LAB — CBC WITH DIFFERENTIAL/PLATELET
Abs Immature Granulocytes: 0.01 10*3/uL (ref 0.00–0.07)
Basophils Absolute: 0 10*3/uL (ref 0.0–0.1)
Basophils Relative: 1 %
Eosinophils Absolute: 0.1 10*3/uL (ref 0.0–0.5)
Eosinophils Relative: 1 %
HCT: 43.3 % (ref 36.0–46.0)
Hemoglobin: 14.1 g/dL (ref 12.0–15.0)
Immature Granulocytes: 0 %
Lymphocytes Relative: 31 %
Lymphs Abs: 1.7 10*3/uL (ref 0.7–4.0)
MCH: 27.1 pg (ref 26.0–34.0)
MCHC: 32.6 g/dL (ref 30.0–36.0)
MCV: 83.3 fL (ref 80.0–100.0)
Monocytes Absolute: 0.5 10*3/uL (ref 0.1–1.0)
Monocytes Relative: 9 %
Neutro Abs: 3.3 10*3/uL (ref 1.7–7.7)
Neutrophils Relative %: 58 %
Platelets: 276 10*3/uL (ref 150–400)
RBC: 5.2 MIL/uL — ABNORMAL HIGH (ref 3.87–5.11)
RDW: 13.5 % (ref 11.5–15.5)
WBC: 5.6 10*3/uL (ref 4.0–10.5)
nRBC: 0 % (ref 0.0–0.2)

## 2021-08-12 LAB — URINALYSIS, COMPLETE (UACMP) WITH MICROSCOPIC
Bilirubin Urine: NEGATIVE
Glucose, UA: NEGATIVE mg/dL
Hgb urine dipstick: NEGATIVE
Ketones, ur: NEGATIVE mg/dL
Nitrite: NEGATIVE
Protein, ur: NEGATIVE mg/dL
Specific Gravity, Urine: 1.027 (ref 1.005–1.030)
pH: 5 (ref 5.0–8.0)

## 2021-08-12 LAB — COMPREHENSIVE METABOLIC PANEL
ALT: 22 U/L (ref 0–44)
AST: 17 U/L (ref 15–41)
Albumin: 4.2 g/dL (ref 3.5–5.0)
Alkaline Phosphatase: 69 U/L (ref 38–126)
Anion gap: 8 (ref 5–15)
BUN: 17 mg/dL (ref 6–20)
CO2: 29 mmol/L (ref 22–32)
Calcium: 8.9 mg/dL (ref 8.9–10.3)
Chloride: 102 mmol/L (ref 98–111)
Creatinine, Ser: 0.6 mg/dL (ref 0.44–1.00)
GFR, Estimated: >60 mL/min (ref >60)
Glucose, Bld: 130 mg/dL — ABNORMAL HIGH (ref 70–99)
Potassium: 4 mmol/L (ref 3.5–5.1)
Sodium: 139 mmol/L (ref 135–145)
Total Bilirubin: 0.4 mg/dL (ref 0.3–1.2)
Total Protein: 7.7 g/dL (ref 6.5–8.1)

## 2021-08-12 LAB — POC URINE PREG, ED
Preg Test, Ur: NEGATIVE
Preg Test, Ur: NEGATIVE

## 2021-08-12 LAB — MONONUCLEOSIS SCREEN: Mono Screen: NEGATIVE

## 2021-08-12 MED ORDER — NITROFURANTOIN MONOHYD MACRO 100 MG PO CAPS: 100.0000 mg | ORAL_CAPSULE | Freq: Two times a day (BID) | ORAL | 0 refills | Status: AC

## 2021-08-12 MED ORDER — IOHEXOL 350 MG/ML SOLN
100.0000 mL | Freq: Once | INTRAVENOUS | Status: AC | PRN
  Administered 2021-08-12: 100 mL via INTRAVENOUS
  Filled 2021-08-12: qty 100

## 2021-08-12 NOTE — ED Provider Notes (Signed)
[facility] Provider Note    Event Date/Time   First MD Initiated Contact with Patient 08/12/21 0827     (approximate)   History   Splenomegaly   HPI  Lori Raymond is a 31 y.o. female with past medical history of cardiomyopathy thanks wound, IBS, PCOS and depression who presents with left upper quadrant pain.  Patient notes that she was recently diagnosed with splenomegaly, she had an ultrasound on 1/30 that demonstrated this.  She saw her doctor yesterday who is referring her to hematology.  Over the last 24 hours she has developed pain in the bilateral axilla as well as global weakness and has urinated on herself several times.  She denies any back pain or numbness or tingling in her legs or any fecal incontinence.  Denies dysuria urgency or frequency.  Patient also endorses having a flare of her genital herpes around the anus and a new sore in her mouth as well.  Patient is frustrated because she is not sure what is causing all of her symptoms.  Has not had fevers or night sweats, does sometimes feel hot and cold during the day.  Patient notes that prior to the diagnosis of splenomegaly she had had left upper quadrant pain for some time but had CAT scans did not demonstrate this finding.       Past Medical History:  Diagnosis Date   Cardiomyopathy (HCC)    Depression    Endometriosis    Herpes    IBS (irritable bowel syndrome)    Polycystic ovarian disease    PTSD (post-traumatic stress disorder)    Stroke (HCC)    Vitamin D deficiency     Patient Active Problem List   Diagnosis Date Noted   Herpes genitalia 05/26/2021     Physical Exam  Triage Vital Signs: ED Triage Vitals  Enc Vitals Group     BP 08/12/21 0821 123/80     Pulse Rate 08/12/21 0821 75     Resp 08/12/21 0821 20     Temp 08/12/21 0821 98.5 F (36.9 C)     Temp Source 08/12/21 0821 Oral     SpO2 08/12/21 0821 95 %     Weight 08/12/21 0822 230 lb (104.3 kg)     Height  08/12/21 0822 5\' 2"  (1.575 m)     Head Circumference --      Peak Flow --      Pain Score 08/12/21 0822 10     Pain Loc --      Pain Edu? --      Excl. in GC? --     Most recent vital signs: Vitals:   08/12/21 0821 08/12/21 1152  BP: 123/80 102/85  Pulse: 75 61  Resp: 20 18  Temp: 98.5 F (36.9 C) 98.6 F (37 C)  SpO2: 95% 100%     General: Awake, no distress.  CV:  Good peripheral perfusion.  Resp:  Normal effort.  Abd:  No distention.  Palpable spleen Neuro:             Awake, Alert, Oriented x 3  Other:  This in the bilateral axillary regions, no erythema or obvious swelling, no palpable lymph node 5/5 strength in the bilateral upper and lower extremities Subjective decrease sensation to light touch in the bilateral upper extremities   ED Results / Procedures / Treatments  Labs (all labs ordered are listed, but only abnormal results are displayed) Labs Reviewed  CBC WITH DIFFERENTIAL/PLATELET -  Abnormal; Notable for the following components:      Result Value   RBC 5.20 (*)    All other components within normal limits  COMPREHENSIVE METABOLIC PANEL - Abnormal; Notable for the following components:   Glucose, Bld 130 (*)    All other components within normal limits  URINALYSIS, COMPLETE (UACMP) WITH MICROSCOPIC - Abnormal; Notable for the following components:   Color, Urine YELLOW (*)    APPearance HAZY (*)    Leukocytes,Ua MODERATE (*)    Bacteria, UA RARE (*)    All other components within normal limits  MONONUCLEOSIS SCREEN  POC URINE PREG, ED  POC URINE PREG, ED     EKG     RADIOLOGY I reviewed the patient's CT of the abdomen pelvis, no acute abnormality   PROCEDURES:  Critical Care performed: No   MEDICATIONS ORDERED IN ED: Medications  iohexol (OMNIPAQUE) 350 MG/ML injection 100 mL (100 mLs Intravenous Contrast Given 08/12/21 1000)     IMPRESSION / MDM / ASSESSMENT AND PLAN / ED COURSE  I reviewed the triage vital signs and the  nursing notes.                              Differential diagnosis includes, but is not limited to, lymphoproliferative disorder, EBV, CMV, splenic sequestration  Patient is a 31 year old female with recently diagnosed splenomegaly who presents with several complaints including urinary incontinence, bilateral axillary pain global weakness.  Patient's vital signs are within normal limits.  On exam she does have a palpable spleen, neurologic exam is reassuring although she does have subjective decrease sensation to light touch in the bilateral upper extremities she has normal strength in her bilateral upper and lower extremities.  She has tenderness in the bilateral axilla but I do not appreciate any lymphadenopathy that is palpable.  I reviewed her CBC which is within normal limits.  She did not have any abnormal cells and platelets hemoglobin and white cells are within normal limits.  CMP also within normal limits.  We will send a UA given this urinary incontinence.  I am not concerned for cauda equina given she has no back pain no numbness or weakness in her legs.  We will obtain a CT of the abdomen pelvis to assess for splenic infarct or other progression of this underlying illness.  Ultimately I think that she needs referral to hematology for further work-up.  UA has some WBCs, multiple squames.  Discussed again with patient if she does feel that she has some urgency and burning so we will treat as simple cystitis with 5 days of Macrobid.  Her CT of the abdomen was read by radiology as a prominent spleen but likely within normal limits without other abnormalities and this is unchanged from her CT in November.  This is reassuring.  Discussed the results with the patient.  She is appropriate for discharge.  Clinical Course as of 08/12/21 1602  Sat Aug 12, 2021  0918 AST: 17 [KM]  0936 Preg Test, Ur: NEGATIVE [KM]    Clinical Course User Index [KM] Rada Hay, MD     FINAL CLINICAL  IMPRESSION(S) / ED DIAGNOSES   Final diagnoses:  Urinary tract infection without hematuria, site unspecified     Rx / DC Orders   ED Discharge Orders          Ordered    nitrofurantoin, macrocrystal-monohydrate, (MACROBID) 100 MG capsule  2 times daily  08/12/21 1130             Note:  This document was prepared using Dragon voice recognition software and may include unintentional dictation errors.   Rada Hay, MD 08/12/21 [phone]

## 2021-08-12 NOTE — ED Triage Notes (Addendum)
Pt via POV from home. Pt was dx with splenomegaly on 1/30. States that she saw her PCP yesterday. Pt states she is waiting on waiting on hematology to see her. Pt has a lump on the LUQ. Pt c/o bilateral underarm pain. Pt states that the pain in her LUQ is worse. Unknown if there is any swelling. Also endorses weakness since last night. Also nausea. Pt is A&OX4 and NAD.

## 2021-08-14 ENCOUNTER — Encounter: Payer: Self-pay | Admitting: Family Medicine

## 2021-08-17 ENCOUNTER — Encounter: Payer: Self-pay | Admitting: Oncology

## 2021-08-17 ENCOUNTER — Inpatient Hospital Stay: Payer: Medicaid Other | Attending: Oncology | Admitting: Oncology

## 2021-08-17 ENCOUNTER — Other Ambulatory Visit: Payer: Self-pay

## 2021-08-17 DIAGNOSIS — Z79899 Other long term (current) drug therapy: Secondary | ICD-10-CM

## 2021-08-17 DIAGNOSIS — E559 Vitamin D deficiency, unspecified: Secondary | ICD-10-CM

## 2021-08-17 DIAGNOSIS — Z8673 Personal history of transient ischemic attack (TIA), and cerebral infarction without residual deficits: Secondary | ICD-10-CM

## 2021-08-17 DIAGNOSIS — R161 Splenomegaly, not elsewhere classified: Secondary | ICD-10-CM

## 2021-08-17 NOTE — Progress Notes (Signed)
Pt has several concerns including enlarged spleen, swelling under left breast that started after Covid pneumonia in Nov 2022 decreased with macrobid rx, decreased recovery time from infections, fluctuations between being cold and sweating. Also states she has had 12lb weight loss in 1 week. Pt states that she has had sensation changes in her hands, she can not distinguish between hot and cold. Pt reports plan to have iud removed tomorrow in case it is contributing to her concerns.

## 2021-08-19 NOTE — Progress Notes (Addendum)
[facility]  [phone] [phone]  ID: [name] OB: 1990-08-31  MR#: [id number]  LSL#:[id number]  Patient Care Team: Center, [facility] as PCP - General  I connected with [name] on 08/21/21 at  2:45 PM EST by video enabled telemedicine visit and verified that I am speaking with the correct person using two identifiers.   I discussed the limitations, risks, security and privacy concerns of performing an evaluation and management service by telemedicine and the availability of in-person appointments. I also discussed with the patient that there may be a patient responsible charge related to this service. The patient expressed understanding and agreed to proceed.   Other persons participating in the visit and their role in the encounter: Patient, MD.  Patients location: Home. Providers location: Clinic.  CHIEF COMPLAINT: Splenomegaly.  INTERVAL HISTORY: Patient was unable to come into clinic secondary to childcare issues, therefore agreed to have her initial evaluation via telemedicine video.  She is a 31 year old female who underwent CT scan for abdominal pain and was noted to have a mildly enlarged spleen of 13 cm.  She has multiple medical complaints all seeming to relate to a COVID infection in fall 2022.  She has no neurologic complaints.  She denies any recent fevers.  She has a good appetite.  She has no chest pain, shortness of breath, cough, hemoptysis.  She has intermittent left flank pain.  She has no nausea, vomiting, constipation, or diarrhea.  She has no melena or hematochezia.  She has no urinary complaints.  Patient offers no further specific complaints today.  REVIEW OF SYSTEMS:   Review of Systems  Constitutional: Negative.  Negative for fever, malaise/fatigue and weight loss.  Respiratory: Negative.  Negative for cough, hemoptysis and shortness of breath.   Cardiovascular: Negative.  Negative for  chest pain and leg swelling.  Gastrointestinal:  Negative for abdominal pain, blood in stool, constipation, diarrhea, melena, nausea and vomiting.  Genitourinary:  Positive for flank pain. Negative for dysuria.  Musculoskeletal:  Negative for back pain.  Skin: Negative.  Negative for rash.  Neurological: Negative.  Negative for dizziness, focal weakness, weakness and headaches.  Psychiatric/Behavioral: Negative.  The patient is not nervous/anxious.    As per HPI. Otherwise, a complete review of systems is negative.  PAST MEDICAL HISTORY: Past Medical History:  Diagnosis Date   Cardiomyopathy (HCC)    CHF (congestive heart failure) (HCC)    Depression    Endometriosis    Herpes    IBS (irritable bowel syndrome)    Polycystic ovarian disease    PTSD (post-traumatic stress disorder)    Stroke (HCC)    Vitamin D deficiency     PAST SURGICAL HISTORY: Past Surgical History:  Procedure Laterality Date   TONSILLECTOMY      FAMILY HISTORY: History reviewed. No pertinent family history.  ADVANCED DIRECTIVES (Y/N):  N  HEALTH MAINTENANCE: Social History   Tobacco Use   Smoking status: Never   Smokeless tobacco: Never  Vaping Use   Vaping Use: Never used  Substance Use Topics   Alcohol use: Not Currently   Drug use: Yes    Types: Marijuana     Colonoscopy:  PAP:  Bone density:  Lipid panel:  Allergies  Allergen Reactions   Lexapro [Escitalopram Oxalate] Anaphylaxis   Estrogens     stroke   Benadryl [Diphenhydramine] Palpitations   Latex Rash   Sulfa Antibiotics Rash    Current Outpatient Medications  Medication Sig Dispense Refill  valACYclovir (VALTREX) 500 MG tablet Take 500 mg by mouth 2 (two) times daily.     albuterol (VENTOLIN HFA) 108 (90 Base) MCG/ACT inhaler Inhale 2 puffs into the lungs every 6 (six) hours as needed for wheezing or shortness of breath. (Patient not taking: Reported on 08/17/2021) 1 each 2   guaiFENesin-codeine (VIRTUSSIN A/C) 100-10  MG/5ML syrup Take 5 mLs by mouth 3 (three) times daily as needed for cough. (Patient not taking: Reported on 08/12/2021) 120 mL 0   lidocaine (LIDODERM) 5 % Place 1 patch onto the skin every 12 (twelve) hours. Remove & Discard patch within 12 hours or as directed by MD (Patient not taking: Reported on 08/12/2021) 10 patch 0   lidocaine (XYLOCAINE) 5 % ointment Apply 1 application topically 3 (three) times daily as needed. (Patient not taking: Reported on 08/12/2021) 35.44 g 0   meloxicam (MOBIC) 15 MG tablet Take 1 tablet (15 mg total) by mouth daily. (Patient not taking: Reported on 08/12/2021) 30 tablet 0   naproxen (NAPROSYN) 375 MG tablet Take 1 tablet (375 mg total) by mouth 2 (two) times daily with a meal. (Patient not taking: Reported on 08/12/2021) 20 tablet 0   predniSONE (STERAPRED UNI-PAK 21 TAB) 10 MG (21) TBPK tablet Take 6 tablets on the first day and decrease by 1 tablet each day until finished. (Patient not taking: Reported on 08/17/2021) 21 tablet 0   No current facility-administered medications for this visit.    OBJECTIVE: There were no vitals filed for this visit.   There is no height or weight on file to calculate BMI.    ECOG FS:0 - Asymptomatic  General: Well-developed, well-nourished, no acute distress. HEENT: Normocephalic. Neuro: Alert, answering all questions appropriately. Cranial nerves grossly intact. Psych: Normal affect.   LAB RESULTS:  Lab Results  Component Value Date   NA 139 08/12/2021   K 4.0 08/12/2021   CL 102 08/12/2021   CO2 29 08/12/2021   GLUCOSE 130 (H) 08/12/2021   BUN 17 08/12/2021   CREATININE 0.60 08/12/2021   CALCIUM 8.9 08/12/2021   PROT 7.7 08/12/2021   ALBUMIN 4.2 08/12/2021   AST 17 08/12/2021   ALT 22 08/12/2021   ALKPHOS 69 08/12/2021   BILITOT 0.4 08/12/2021   GFRNONAA >60 08/12/2021   GFRAA >60 01/25/2020    Lab Results  Component Value Date   WBC 5.6 08/12/2021   NEUTROABS 3.3 08/12/2021   HGB 14.1 08/12/2021   HCT 43.3  08/12/2021   MCV 83.3 08/12/2021   PLT 276 08/12/2021     STUDIES: CT ABDOMEN PELVIS W CONTRAST  Result Date: 08/12/2021 CLINICAL DATA:  Right upper quadrant pain. Diagnosed with splenomegaly on 07/31/2021. Also complaining of weakness. EXAM: CT ABDOMEN AND PELVIS WITH CONTRAST TECHNIQUE: Multidetector CT imaging of the abdomen and pelvis was performed using the standard protocol following bolus administration of intravenous contrast. RADIATION DOSE REDUCTION: This exam was performed according to the departmental dose-optimization program which includes automated exposure control, adjustment of the mA and/or kV according to patient size and/or use of iterative reconstruction technique. CONTRAST:  OMNIPAQUE IOHEXOL 350 MG/ML SOLN COMPARISON:  05/23/2021.  Ultrasound dated 07/31/2021. FINDINGS: Lower chest: Clear lung bases. Hepatobiliary: No focal liver abnormality is seen. No gallstones, gallbladder wall thickening, or biliary dilatation. Pancreas: Unremarkable. No pancreatic ductal dilatation or surrounding inflammatory changes. Spleen: Spleen measures 13 cm, superior to inferior, by 11 x 5.5 cm transversely, which is top normal in size, and unchanged from the prior CT. No  splenic mass or focal lesion. Adrenals/Urinary Tract: Adrenal glands are unremarkable. Kidneys are normal, without renal calculi, focal lesion, or hydronephrosis. Bladder is unremarkable. Stomach/Bowel: Stomach is within normal limits. Appendix appears normal. No evidence of bowel wall thickening, distention, or inflammatory changes. Vascular/Lymphatic: No significant vascular findings are present. No enlarged abdominal or pelvic lymph nodes. Reproductive: Well-positioned IUD. Uterus otherwise unremarkable. No adnexal masses. Other: No abdominal wall hernia or abnormality. No abdominopelvic ascites. Musculoskeletal: No acute or significant osseous findings. IMPRESSION: 1. No acute findings within the abdomen or pelvis. 2. Prominent  spleen. Splenic volumes have a wide range of normal, and this is expected to be normal for this patient, unchanged compared to the prior CT from 05/23/2021. Remainder of the exam is unremarkable. No findings to account for the patient's symptoms. Electronically Signed   By: Amie Portland M.D.   On: 08/12/2021 11:09   US Abdomen Limited  Result Date: 07/31/2021 CLINICAL DATA:  ches wall left below costal border EXAM: ULTRASOUND ABDOMEN LIMITED LEFT UPPER QUADRANT COMPARISON:  CT AP, 05/23/2021. FINDINGS: Focused ultrasound at the area of palpable concern, along the LEFT upper quadrant. Palpable abnormality is consistent with the patient's spleen. The spleen is enlarged, measuring up to 12.4 x 14.7 x 5.3 cm with a calculated volume of 504 cm^3. IMPRESSION: 1. Palpable abnormality LEFT upper quadrant is consistent with the patient's spleen 2. Splenomegaly, measuring up to 14.7 cm. Electronically Signed   By: Roanna Banning M.D.   On: 07/31/2021 09:24    ASSESSMENT: Splenomegaly.  PLAN:    Splenomegaly: CT results from August 12, 2021 reviewed independently and reported as above revealed a "prominent spleen".  Patient's spleen measured 13 cm which is at the expected range in size for this particular patient and unchanged from previous CT scan on May 23, 2021.  All of her laboratory work is also within normal limits.  No intervention is needed at this time.  No further imaging is necessary.  No follow-up has been scheduled.  I provided 45 minutes of face-to-face video visit time during this encounter which included chart review, counseling, and coordination of care as documented above.   Patient expressed understanding and was in agreement with this plan. She also understands that She can call clinic at any time with any questions, concerns, or complaints.     Jeralyn Ruths, MD   08/19/2021 10:39 AM

## 2021-08-25 ENCOUNTER — Emergency Department (HOSPITAL_BASED_OUTPATIENT_CLINIC_OR_DEPARTMENT_OTHER): Payer: Medicaid Other

## 2021-08-25 ENCOUNTER — Emergency Department (HOSPITAL_BASED_OUTPATIENT_CLINIC_OR_DEPARTMENT_OTHER)
Admission: EM | Admit: 2021-08-25 | Discharge: 2021-08-25 | Disposition: A | Discharge status: Home / Self Care | Payer: Medicaid Other | Attending: Emergency Medicine | Admitting: Emergency Medicine

## 2021-08-25 ENCOUNTER — Encounter (HOSPITAL_BASED_OUTPATIENT_CLINIC_OR_DEPARTMENT_OTHER): Payer: Self-pay | Admitting: *Deleted

## 2021-08-25 ENCOUNTER — Other Ambulatory Visit: Payer: Self-pay

## 2021-08-25 DIAGNOSIS — R1012 Left upper quadrant pain: Secondary | ICD-10-CM | POA: Diagnosis present

## 2021-08-25 DIAGNOSIS — Z91040 Latex allergy status: Secondary | ICD-10-CM | POA: Insufficient documentation

## 2021-08-25 DIAGNOSIS — R110 Nausea: Secondary | ICD-10-CM | POA: Diagnosis not present

## 2021-08-25 DIAGNOSIS — R079 Chest pain, unspecified: Secondary | ICD-10-CM | POA: Diagnosis not present

## 2021-08-25 DIAGNOSIS — Z8616 Personal history of COVID-19: Secondary | ICD-10-CM | POA: Insufficient documentation

## 2021-08-25 DIAGNOSIS — R7309 Other abnormal glucose: Secondary | ICD-10-CM | POA: Insufficient documentation

## 2021-08-25 DIAGNOSIS — R630 Anorexia: Secondary | ICD-10-CM | POA: Diagnosis not present

## 2021-08-25 LAB — URINALYSIS, ROUTINE W REFLEX MICROSCOPIC
Bilirubin Urine: NEGATIVE
Glucose, UA: NEGATIVE mg/dL
Hgb urine dipstick: NEGATIVE
Ketones, ur: NEGATIVE mg/dL
Leukocytes,Ua: NEGATIVE
Nitrite: NEGATIVE
Protein, ur: NEGATIVE mg/dL
Specific Gravity, Urine: 1.015 (ref 1.005–1.030)
pH: 6.5 (ref 5.0–8.0)

## 2021-08-25 LAB — BASIC METABOLIC PANEL
Anion gap: 8 (ref 5–15)
BUN: 16 mg/dL (ref 6–20)
CO2: 25 mmol/L (ref 22–32)
Calcium: 8.5 mg/dL — ABNORMAL LOW (ref 8.9–10.3)
Chloride: 106 mmol/L (ref 98–111)
Creatinine, Ser: 0.58 mg/dL (ref 0.44–1.00)
GFR, Estimated: >60 mL/min (ref >60)
Glucose, Bld: 108 mg/dL — ABNORMAL HIGH (ref 70–99)
Potassium: 3.7 mmol/L (ref 3.5–5.1)
Sodium: 139 mmol/L (ref 135–145)

## 2021-08-25 LAB — CBC WITH DIFFERENTIAL/PLATELET
Abs Immature Granulocytes: 0.02 10*3/uL (ref 0.00–0.07)
Basophils Absolute: 0 10*3/uL (ref 0.0–0.1)
Basophils Relative: 0 %
Eosinophils Absolute: 0.1 10*3/uL (ref 0.0–0.5)
Eosinophils Relative: 2 %
HCT: 37.8 % (ref 36.0–46.0)
Hemoglobin: 12.2 g/dL (ref 12.0–15.0)
Immature Granulocytes: 0 %
Lymphocytes Relative: 35 %
Lymphs Abs: 2.3 10*3/uL (ref 0.7–4.0)
MCH: 26.7 pg (ref 26.0–34.0)
MCHC: 32.3 g/dL (ref 30.0–36.0)
MCV: 82.7 fL (ref 80.0–100.0)
Monocytes Absolute: 0.6 10*3/uL (ref 0.1–1.0)
Monocytes Relative: 9 %
Neutro Abs: 3.7 10*3/uL (ref 1.7–7.7)
Neutrophils Relative %: 54 %
Platelets: 253 10*3/uL (ref 150–400)
RBC: 4.57 MIL/uL (ref 3.87–5.11)
RDW: 14.3 % (ref 11.5–15.5)
WBC: 6.8 10*3/uL (ref 4.0–10.5)
nRBC: 0 % (ref 0.0–0.2)

## 2021-08-25 LAB — TROPONIN I (HIGH SENSITIVITY): Troponin I (High Sensitivity): 2 ng/L (ref <18)

## 2021-08-25 LAB — HCG, SERUM, QUALITATIVE: Preg, Serum: NEGATIVE

## 2021-08-25 LAB — BRAIN NATRIURETIC PEPTIDE: B Natriuretic Peptide: 13.4 pg/mL (ref 0.0–100.0)

## 2021-08-25 MED ORDER — ONDANSETRON HCL 4 MG/2ML IJ SOLN
4.0000 mg | Freq: Once | INTRAMUSCULAR | Status: DC
  Filled 2021-08-25: qty 2

## 2021-08-25 MED ORDER — PANTOPRAZOLE SODIUM 40 MG PO TBEC: 40.0000 mg | DELAYED_RELEASE_TABLET | Freq: Every day | ORAL | 1 refills | Status: DC

## 2021-08-25 MED ORDER — IOHEXOL 300 MG/ML  SOLN
100.0000 mL | Freq: Once | INTRAMUSCULAR | Status: AC | PRN
  Administered 2021-08-25: 100 mL via INTRAVENOUS

## 2021-08-25 MED ORDER — PANTOPRAZOLE SODIUM 40 MG IV SOLR
40.0000 mg | Freq: Once | INTRAVENOUS | Status: AC
  Administered 2021-08-25: 40 mg via INTRAVENOUS
  Filled 2021-08-25: qty 10

## 2021-08-25 MED ORDER — SODIUM CHLORIDE 0.9 % IV BOLUS
500.0000 mL | Freq: Once | INTRAVENOUS | Status: AC
Start: 2021-08-25 — End: 2021-08-25
  Administered 2021-08-25: 500 mL via INTRAVENOUS

## 2021-08-25 NOTE — ED Provider Notes (Signed)
[facility] EMERGENCY DEPT Provider Note   CSN: [id number] Arrival date & time: 08/25/21  1748     History  Chief Complaint  Patient presents with   Abdominal Pain    Lori Raymond is a 31 y.o. female.  She has had on and off left upper quadrant pain radiating to her back that is been going on since she had COVID back in September.  She had a CAT scan that showed her spleen was slightly enlarged.  She saw oncology who told her that they did not think that was a problem.  For the last 2 days she has had increased burning pain of her left upper quadrant, decreased p.o. intake, burning radiating into her chest, increased pain when taking a deep breath.  No cough.  No fevers or chills.  No vomiting or diarrhea but is having nausea.  The history is provided by the patient.  Abdominal Pain Pain location:  LUQ Pain quality: burning   Pain radiates to:  Back Pain severity:  Severe Onset quality:  Gradual Duration:  2 days Timing:  Constant Progression:  Unchanged Chronicity:  New Context: not trauma   Relieved by:  Nothing Worsened by:  Deep breathing Ineffective treatments:  None tried Associated symptoms: anorexia, chest pain and nausea   Associated symptoms: no cough, no diarrhea, no dysuria, no fever, no sore throat and no vomiting       Home Medications Prior to Admission medications   Medication Sig Start Date End Date Taking? Authorizing Provider  albuterol (VENTOLIN HFA) 108 (90 Base) MCG/ACT inhaler Inhale 2 puffs into the lungs every 6 (six) hours as needed for wheezing or shortness of breath. Patient not taking: Reported on 08/17/2021 03/17/21   Sable Feil, PA-C  guaiFENesin-codeine [facility] A/C) 100-10 MG/5ML syrup Take 5 mLs by mouth 3 (three) times daily as needed for cough. Patient not taking: Reported on 08/12/2021 03/17/21   Sable Feil, PA-C  lidocaine (LIDODERM) 5 % Place 1 patch onto the skin every 12 (twelve) hours. Remove & Discard  patch within 12 hours or as directed by MD Patient not taking: Reported on 08/12/2021 06/28/21 06/28/22  Vladimir Crofts, MD  lidocaine (XYLOCAINE) 5 % ointment Apply 1 application topically 3 (three) times daily as needed. Patient not taking: Reported on 08/12/2021 05/26/21   Carvel Getting, NP  meloxicam (MOBIC) 15 MG tablet Take 1 tablet (15 mg total) by mouth daily. Patient not taking: Reported on 08/12/2021 03/02/19   Norval Gable, MD  naproxen (NAPROSYN) 375 MG tablet Take 1 tablet (375 mg total) by mouth 2 (two) times daily with a meal. Patient not taking: Reported on 08/12/2021 03/24/21   Lavonia Drafts, MD  predniSONE (STERAPRED UNI-PAK 21 TAB) 10 MG (21) TBPK tablet Take 6 tablets on the first day and decrease by 1 tablet each day until finished. Patient not taking: Reported on 08/17/2021 06/06/19   Sherrie George B, FNP  valACYclovir (VALTREX) 500 MG tablet Take 500 mg by mouth 2 (two) times daily.    [provider]      Allergies    Lexapro [escitalopram oxalate], Estrogens, Benadryl [diphenhydramine], Latex, and Sulfa antibiotics    Review of Systems   Review of Systems  Constitutional:  Negative for fever.  HENT:  Negative for sore throat.   Eyes:  Negative for visual disturbance.  Respiratory:  Negative for cough.   Cardiovascular:  Positive for chest pain.  Gastrointestinal:  Positive for abdominal pain, anorexia and nausea. Negative  for diarrhea and vomiting.  Genitourinary:  Negative for dysuria.  Musculoskeletal:  Positive for back pain.  Skin:  Negative for rash.   Physical Exam Updated Vital Signs BP 137/78 (BP Location: Right Arm)    Pulse 71    Temp 97.9 F (36.6 C)    Resp 16    Wt 104.3 kg    SpO2 100%    BMI 42.07 kg/m  Physical Exam Vitals and nursing note reviewed.  Constitutional:      General: She is not in acute distress.    Appearance: Normal appearance. She is well-developed. She is obese.  HENT:     Head: Normocephalic and atraumatic.  Eyes:      Conjunctiva/sclera: Conjunctivae normal.  Cardiovascular:     Rate and Rhythm: Normal rate and regular rhythm.     Heart sounds: No murmur heard. Pulmonary:     Effort: Pulmonary effort is normal. No respiratory distress.     Breath sounds: Normal breath sounds.  Abdominal:     General: There is no distension.     Palpations: Abdomen is soft. There is no mass.     Tenderness: There is abdominal tenderness in the left upper quadrant. There is no guarding or rebound.  Musculoskeletal:        General: No swelling.     Cervical back: Neck supple.  Skin:    General: Skin is warm and dry.     Capillary Refill: Capillary refill takes less than 2 seconds.  Neurological:     General: No focal deficit present.     Mental Status: She is alert.    ED Results / Procedures / Treatments   Labs (all labs ordered are listed, but only abnormal results are displayed) Labs Reviewed  BASIC METABOLIC PANEL - Abnormal; Notable for the following components:      Result Value   Glucose, Bld 108 (*)    Calcium 8.5 (*)    All other components within normal limits  CBC WITH DIFFERENTIAL/PLATELET  HCG, SERUM, QUALITATIVE  URINALYSIS, ROUTINE W REFLEX MICROSCOPIC  BRAIN NATRIURETIC PEPTIDE  TROPONIN I (HIGH SENSITIVITY)    EKG EKG Interpretation  Date/Time:  Friday August 25 2021 18:39:28 EST Ventricular Rate:  60 PR Interval:  175 QRS Duration: 121 QT Interval:  435 QTC Calculation: 435 R Axis:   82 Text Interpretation: Sinus rhythm Nonspecific intraventricular conduction delay Borderline T abnormalities, anterior leads No significant change since prior 12/22 Confirmed by Aletta Edouard [phone]) on 08/25/2021 6:53:55 PM  Radiology CT Abdomen Pelvis W Contrast  Result Date: 08/25/2021 CLINICAL DATA:  Left upper quadrant pain EXAM: CT ABDOMEN AND PELVIS WITH CONTRAST TECHNIQUE: Multidetector CT imaging of the abdomen and pelvis was performed using the standard protocol following bolus  administration of intravenous contrast. RADIATION DOSE REDUCTION: This exam was performed according to the departmental dose-optimization program which includes automated exposure control, adjustment of the mA and/or kV according to patient size and/or use of iterative reconstruction technique. CONTRAST:  126mL OMNIPAQUE IOHEXOL 300 MG/ML  SOLN COMPARISON:  08/12/2021 FINDINGS: Lower chest: No acute abnormality Hepatobiliary: No focal hepatic abnormality. Gallbladder unremarkable. Pancreas: No focal abnormality or ductal dilatation. Spleen: Spleen measures 13 cm in craniocaudal length, stable since prior study, borderline in size. No focal abnormality. Adrenals/Urinary Tract: No adrenal abnormality. No focal renal abnormality. No stones or hydronephrosis. Urinary bladder is unremarkable. Stomach/Bowel: Stomach, large and small bowel grossly unremarkable. Normal appendix. Vascular/Lymphatic: No evidence of aneurysm or adenopathy. Reproductive: Uterus and adnexa unremarkable.  No mass. Other: No free fluid or free air. Musculoskeletal: No acute bony abnormality. IMPRESSION: Borderline spleen size, stable since prior study. No acute findings in the abdomen or pelvis. Electronically Signed   By: Rolm Baptise M.D.   On: 08/25/2021 20:41   DG Chest Port 1 View  Result Date: 08/25/2021 CLINICAL DATA:  Upper abdominal pain EXAM: PORTABLE CHEST 1 VIEW COMPARISON:  06/28/2021 FINDINGS: The heart size and mediastinal contours are within normal limits. Both lungs are clear. The visualized skeletal structures are unremarkable. IMPRESSION: No active disease. Electronically Signed   By: Fidela Salisbury M.D.   On: 08/25/2021 19:52    Procedures Procedures    Medications Ordered in ED Medications  sodium chloride 0.9 % bolus 500 mL (has no administration in time range)  pantoprazole (PROTONIX) injection 40 mg (has no administration in time range)    ED Course/ Medical Decision Making/ A&P Clinical Course as of  08/26/21 1000  Fri Aug 25, 2021  1825 Reviewed results of work-up with her.  She still says she is in significant discomfort and wants to undergo a CT to make sure her spleen has not worsened. [MB]  1955 Chest x-ray ordered and interpreted by me as no acute infiltrates no free air. [MB]    Clinical Course User Index [MB] Hayden Rasmussen, MD                           Medical Decision Making Amount and/or Complexity of Data Reviewed Labs: ordered. Radiology: ordered.  Risk Prescription drug management.  This patient complains of left upper quadrant burning pain radiating to her chest back associated with some nausea; this involves an extensive number of treatment Options and is a complaint that carries with it a high risk of complications and morbidity. The differential includes peptic ulcer disease, gastritis, reflux, splenomegaly, splenic infarct, UTI, musculoskeletal  I ordered, reviewed and interpreted labs, which included CBC with normal white count normal hemoglobin, chemistries normal other than mildly elevated glucose, pregnancy test negative, troponins flat, BNP normal, urinalysis without signs of infection I ordered medication IV fluids and Protonix and reviewed PMP when indicated. I ordered imaging studies which included chest x-ray and CT abdomen and pelvis and I independently    visualized and interpreted imaging which showed borderline splenomegaly otherwise no acute process Previous records obtained and reviewed in epic, patient has had a couple of visits for similar presentations Cardiac monitoring reviewed, normal sinus rhythm with occasional sinus bradycardia Social determinants considered, no significant barriers Critical Interventions: None  After the interventions stated above, I reevaluated the patient and found patient to be symptomatically tender left upper quadrant.  Reviewed results of work-up with her. Admission and further testing considered, no indications  for admission or further testing at this time.  Will place on PPI.  Recommended close follow-up with her treating providers.  Also given referral to GI.  Return instructions discussed.          Final Clinical Impression(s) / ED Diagnoses Final diagnoses:  LUQ pain    Rx / DC Orders ED Discharge Orders          Ordered    pantoprazole (PROTONIX) 40 MG tablet  Daily        08/25/21 2114    Ambulatory referral to Gastroenterology        08/25/21 2114              Hayden Rasmussen, MD  08/26/21 1003 ° °

## 2021-08-25 NOTE — ED Notes (Signed)
Patient transported to CT 

## 2021-08-25 NOTE — ED Notes (Addendum)
Patient verbalizes understanding of discharge instructions. Opportunities for questioning and answers were provided. Patient discharged from ED to home.

## 2021-08-25 NOTE — Discharge Instructions (Signed)
You were seen in the emergency department for worsening left upper quadrant abdominal pain.  You had lab work urinalysis and a CAT scan of your abdomen and pelvis that did not show an obvious explanation for your symptoms.  We have started you on some acid medication.  We have put in a referral for you to see gastroenterology.  He should also contact her cardiologist regarding your other cardiac concerns.  Return to the emergency department if any worsening or concerning symptoms

## 2021-08-25 NOTE — ED Triage Notes (Signed)
Pt is here for evaluation of LUQ pain.  Pt states that she is having burning pain in LUQ which is new over the past 48 hours.  Pt states that she was dx with splenomegaly on 1/30 and she has been waiting for follow up from that but is concerned today due to the new LUQ pain which is burning and associated with lack of appetite and belching.

## 2021-08-28 ENCOUNTER — Telehealth: Payer: Self-pay

## 2021-08-28 NOTE — Telephone Encounter (Signed)
error 

## 2021-08-28 NOTE — Telephone Encounter (Signed)
Scheduled for 11/14/2021 °

## 2021-10-12 ENCOUNTER — Telehealth: Payer: Medicaid Other | Admitting: Physician Assistant

## 2021-10-12 DIAGNOSIS — R112 Nausea with vomiting, unspecified: Secondary | ICD-10-CM

## 2021-10-12 DIAGNOSIS — M791 Myalgia, unspecified site: Secondary | ICD-10-CM

## 2021-10-12 MED ORDER — ONDANSETRON 4 MG PO TBDP: 4.0000 mg | ORAL_TABLET | Freq: Three times a day (TID) | ORAL | 0 refills | Status: DC | PRN

## 2021-10-12 NOTE — Progress Notes (Signed)
?Virtual Visit Consent  ? ?Lori Raymond, you are scheduled for a virtual visit with a [facility] provider today.   ?  ?Just as with appointments in the office, your consent must be obtained to participate.  Your consent will be active for this visit and any virtual visit you may have with one of our providers in the next 365 days.   ?  ?If you have a MyChart account, a copy of this consent can be sent to you electronically.  All virtual visits are billed to your insurance company just like a traditional visit in the office.   ? ?As this is a virtual visit, video technology does not allow for your provider to perform a traditional examination.  This may limit your provider's ability to fully assess your condition.  If your provider identifies any concerns that need to be evaluated in person or the need to arrange testing (such as labs, EKG, etc.), we will make arrangements to do so.   ?  ?Although advances in technology are sophisticated, we cannot ensure that it will always work on either your end or our end.  If the connection with a video visit is poor, the visit may have to be switched to a telephone visit.  With either a video or telephone visit, we are not always able to ensure that we have a secure connection.    ? ?I need to obtain your verbal consent now.   Are you willing to proceed with your visit today?  ?  ?Lori Raymond has provided verbal consent on 10/12/2021 for a virtual visit (video or telephone). ?  ?Lori Loveless, PA-C  ? ?Date: 10/12/2021 3:45 PM ? ? ?Virtual Visit via Video Note  ? ?Lori Raymond, connected with  Lori Raymond  ([id number], [birth year]) on 10/12/21 at  3:45 PM EDT by a video-enabled telemedicine application and verified that I am speaking with the correct person using two identifiers. ? ?Location: ?Patient: Virtual Visit Location Patient: Home ?Provider: Virtual Visit Location Provider: Home Office ?  ?I discussed the limitations of evaluation and management  by telemedicine and the availability of in person appointments. The patient expressed understanding and agreed to proceed.   ? ?History of Present Illness: ?Lori Raymond is a 31 y.o. who identifies as a female who was assigned female at birth, and is being seen today for nausea and body aches. ? ?HPI: URI  ?This is a new problem. The current episode started yesterday. The problem has been gradually worsening. There has been no fever. Associated symptoms include congestion, coughing, diarrhea, headaches, nausea, a plugged ear sensation, sinus pain and a sore throat. Pertinent negatives include no ear pain, rhinorrhea or vomiting. Associated symptoms comments: Chills, myalgias. She has tried acetaminophen, increased fluids and sleep for the symptoms. The treatment provided no relief.   ?Has had one rapid covid test that is negative. ? ?Problems:  ?Patient Active Problem List  ? Diagnosis Date Noted  ? Herpes genitalia 05/26/2021  ?  ?Allergies:  ?Allergies  ?Allergen Reactions  ? Lexapro [Escitalopram Oxalate] Anaphylaxis  ? Estrogens   ?  stroke  ? Benadryl [Diphenhydramine] Palpitations  ? Latex Rash  ? Sulfa Antibiotics Rash  ? ?Medications:  ?Current Outpatient Medications:  ?  ondansetron (ZOFRAN-ODT) 4 MG disintegrating tablet, Take 1 tablet (4 mg total) by mouth every 8 (eight) hours as needed for nausea or vomiting., Disp: 20 tablet, Rfl: 0 ?  albuterol (VENTOLIN HFA) 108 (90 Base) MCG/ACT inhaler, Inhale  2 puffs into the lungs every 6 (six) hours as needed for wheezing or shortness of breath. (Patient not taking: Reported on 08/17/2021), Disp: 1 each, Rfl: 2 ?  guaiFENesin-codeine (VIRTUSSIN A/C) 100-10 MG/5ML syrup, Take 5 mLs by mouth 3 (three) times daily as needed for cough. (Patient not taking: Reported on 08/12/2021), Disp: 120 mL, Rfl: 0 ?  lidocaine (LIDODERM) 5 %, Place 1 patch onto the skin every 12 (twelve) hours. Remove & Discard patch within 12 hours or as directed by MD (Patient not taking:  Reported on 08/12/2021), Disp: 10 patch, Rfl: 0 ?  lidocaine (XYLOCAINE) 5 % ointment, Apply 1 application topically 3 (three) times daily as needed. (Patient not taking: Reported on 08/12/2021), Disp: 35.44 g, Rfl: 0 ?  meloxicam (MOBIC) 15 MG tablet, Take 1 tablet (15 mg total) by mouth daily. (Patient not taking: Reported on 08/12/2021), Disp: 30 tablet, Rfl: 0 ?  naproxen (NAPROSYN) 375 MG tablet, Take 1 tablet (375 mg total) by mouth 2 (two) times daily with a meal. (Patient not taking: Reported on 08/12/2021), Disp: 20 tablet, Rfl: 0 ?  pantoprazole (PROTONIX) 40 MG tablet, Take 1 tablet (40 mg total) by mouth daily., Disp: 30 tablet, Rfl: 1 ?  predniSONE (STERAPRED UNI-PAK 21 TAB) 10 MG (21) TBPK tablet, Take 6 tablets on the first day and decrease by 1 tablet each day until finished. (Patient not taking: Reported on 08/17/2021), Disp: 21 tablet, Rfl: 0 ?  valACYclovir (VALTREX) 500 MG tablet, Take 500 mg by mouth 2 (two) times daily., Disp: , Rfl:  ? ?Observations/Objective: ?Patient is well-developed, well-nourished in no acute distress.  ?Resting comfortably at home.  ?Head is normocephalic, atraumatic.  ?No labored breathing.  ?Speech is clear and coherent with logical content.  ?Patient is alert and oriented at baseline.  ? ? ?Assessment and Plan: ?1. Nausea and vomiting, unspecified vomiting type ?- ondansetron (ZOFRAN-ODT) 4 MG disintegrating tablet; Take 1 tablet (4 mg total) by mouth every 8 (eight) hours as needed for nausea or vomiting.  Dispense: 20 tablet; Refill: 0 ? ?2. Myalgia ?- ondansetron (ZOFRAN-ODT) 4 MG disintegrating tablet; Take 1 tablet (4 mg total) by mouth every 8 (eight) hours as needed for nausea or vomiting.  Dispense: 20 tablet; Refill: 0 ? ?- Suspect viral infection ?- Symptomatic management of choice ?- Zofran for nausea ?- Push fluids ?- Rest ?- Seek in person evaluation if worsening or fails to improve ? ?Follow Up Instructions: ?I discussed the assessment and treatment plan with  the patient. The patient was provided an opportunity to ask questions and all were answered. The patient agreed with the plan and demonstrated an understanding of the instructions.  A copy of instructions were sent to the patient via MyChart unless otherwise noted below.  ? ? ?The patient was advised to call back or seek an in-person evaluation if the symptoms worsen or if the condition fails to improve as anticipated. ? ?Time:  ?I spent 13 minutes with the patient via telehealth technology discussing the above problems/concerns.   ? ?Lori Loveless, PA-C ?

## 2021-10-12 NOTE — Patient Instructions (Signed)
?Lori Raymond, thank you for joining Mar Daring, PA-C for today's virtual visit.  While this provider is not your primary care provider (PCP), if your PCP is located in our provider database this encounter information will be shared with them immediately following your visit. ? ?Consent: ?(Patient) Lori Raymond provided verbal consent for this virtual visit at the beginning of the encounter. ? ?Current Medications: ? ?Current Outpatient Medications:  ?  ondansetron (ZOFRAN-ODT) 4 MG disintegrating tablet, Take 1 tablet (4 mg total) by mouth every 8 (eight) hours as needed for nausea or vomiting., Disp: 20 tablet, Rfl: 0 ?  albuterol (VENTOLIN HFA) 108 (90 Base) MCG/ACT inhaler, Inhale 2 puffs into the lungs every 6 (six) hours as needed for wheezing or shortness of breath. (Patient not taking: Reported on 08/17/2021), Disp: 1 each, Rfl: 2 ?  guaiFENesin-codeine (VIRTUSSIN A/C) 100-10 MG/5ML syrup, Take 5 mLs by mouth 3 (three) times daily as needed for cough. (Patient not taking: Reported on 08/12/2021), Disp: 120 mL, Rfl: 0 ?  lidocaine (LIDODERM) 5 %, Place 1 patch onto the skin every 12 (twelve) hours. Remove & Discard patch within 12 hours or as directed by MD (Patient not taking: Reported on 08/12/2021), Disp: 10 patch, Rfl: 0 ?  lidocaine (XYLOCAINE) 5 % ointment, Apply 1 application topically 3 (three) times daily as needed. (Patient not taking: Reported on 08/12/2021), Disp: 35.44 g, Rfl: 0 ?  meloxicam (MOBIC) 15 MG tablet, Take 1 tablet (15 mg total) by mouth daily. (Patient not taking: Reported on 08/12/2021), Disp: 30 tablet, Rfl: 0 ?  naproxen (NAPROSYN) 375 MG tablet, Take 1 tablet (375 mg total) by mouth 2 (two) times daily with a meal. (Patient not taking: Reported on 08/12/2021), Disp: 20 tablet, Rfl: 0 ?  pantoprazole (PROTONIX) 40 MG tablet, Take 1 tablet (40 mg total) by mouth daily., Disp: 30 tablet, Rfl: 1 ?  predniSONE (STERAPRED UNI-PAK 21 TAB) 10 MG (21) TBPK tablet, Take 6 tablets  on the first day and decrease by 1 tablet each day until finished. (Patient not taking: Reported on 08/17/2021), Disp: 21 tablet, Rfl: 0 ?  valACYclovir (VALTREX) 500 MG tablet, Take 500 mg by mouth 2 (two) times daily., Disp: , Rfl:   ? ?Medications ordered in this encounter:  ?Meds ordered this encounter  ?Medications  ? ondansetron (ZOFRAN-ODT) 4 MG disintegrating tablet  ?  Sig: Take 1 tablet (4 mg total) by mouth every 8 (eight) hours as needed for nausea or vomiting.  ?  Dispense:  20 tablet  ?  Refill:  0  ?  Order Specific Question:   Supervising Provider  ?  Answer:   Noemi Chapel [[id number]]  ?  ? ?*If you need refills on other medications prior to your next appointment, please contact your pharmacy* ? ?Follow-Up: ?Call back or seek an in-person evaluation if the symptoms worsen or if the condition fails to improve as anticipated. ? ?Other Instructions ? ?Viral Illness, Adult ?Viruses are tiny germs that can get into a person's body and cause illness. There are many different types of viruses, and they cause many types of illness. Viral illnesses can range from mild to severe. They can affect various parts of the body. ?Short-term conditions that are caused by a virus include colds and the flu (influenza). Long-term conditions that are caused by a virus include herpes, shingles, and HIV (human immunodeficiency virus) infection. A few viruses have been linked to certain cancers. ?What are the causes? ?Many types of viruses can  cause illness. Viruses invade cells in your body, multiply, and cause the infected cells to work abnormally or die. When these cells die, they release more of the virus. When this happens, you develop symptoms of the illness, and the virus continues to spread to other cells. If the virus takes over the function of the cell, it can cause the cell to divide and grow out of control. This happens when a virus causes cancer. ?Different viruses get into the body in different ways. You can get a  virus by: ?Swallowing food or water that has come in contact with the virus (is contaminated). ?Breathing in droplets that have been coughed or sneezed into the air by an infected person. ?Touching a surface that has been contaminated with the virus and then touching your eyes, nose, or mouth. ?Being bitten by an insect or animal that carries the virus. ?Having sexual contact with a person who is infected with the virus. ?Being exposed to blood or fluids that contain the virus, either through an open cut or during a transfusion. ?If a virus enters your body, your body's defense system (immune system) will try to fight the virus. You may be at higher risk for a viral illness if your immune system is weak. ?What are the signs or symptoms? ?You may have these symptoms, depending on the type of virus and the location of the cells that it invades: ?Cold and flu viruses: ?Fever. ?Headache. ?Sore throat. ?Muscle aches. ?Stuffy nose (nasal congestion). ?Cough. ?Digestive system (gastrointestinal) viruses: ?Fever. ?Pain in the abdomen. ?Nausea. ?Diarrhea. ?Liver viruses (hepatitis): ?Loss of appetite. ?Tiredness. ?Skin or the white parts of your eyes turning yellow (jaundice). ?Brain and spinal cord viruses: ?Fever. ?Headache. ?Stiff neck. ?Nausea and vomiting. ?Confusion or sleepiness. ?Skin viruses: ?Warts. ?Itching. ?Rash. ?Sexually transmitted viruses: ?Discharge. ?Swelling. ?Redness. ?Rash. ?How is this diagnosed? ?This condition may be diagnosed based on one or more of the following: ?Symptoms. ?Medical history. ?Physical exam. ?Blood test, sample of mucus from your lungs (sputum sample), stool sample, or a swab of body fluids or a skin sore (lesion). ?How is this treated? ?Viruses can be hard to treat because they live within cells. Antibiotic medicines do not treat viruses because these medicines do not get inside cells. Treatment for a viral illness may include: ?Resting and drinking plenty of fluids. ?Medicines to  relieve symptoms. These can include over-the-counter medicine for pain and fever, medicines for cough or congestion, and medicines to relieve diarrhea. ?Antiviral medicines. These medicines are available only for certain types of viruses. ?Some viral illnesses can be prevented with vaccinations. A common example is the flu shot. ?Follow these instructions at home: ?Medicines ?Take over-the-counter and prescription medicines only as told by your health care provider. ?If you were prescribed an antiviral medicine, take it as told by your health care provider. Do not stop taking the antiviral even if you start to feel better. ?Be aware of when antibiotics are needed and when they are not needed. Antibiotics do not treat viruses. You may get an antibiotic if your health care provider thinks that you may have, or are at risk for, a bacterial infection and you have a viral infection. ?Do not ask for an antibiotic prescription if you have been diagnosed with a viral illness. Antibiotics will not make your illness go away faster. ?Frequently taking antibiotics when they are not needed can lead to antibiotic resistance. When this develops, the medicine no longer works against the bacteria that it normally fights. ?General  instructions ? ?Drink enough fluids to keep your urine pale yellow. ?Rest as much as possible. ?Return to your normal activities as told by your health care provider. Ask your health care provider what activities are safe for you. ?Keep all follow-up visits as told by your health care provider. This is important. ?How is this prevented? ?To reduce your risk of viral illness: ?Wash your hands often with soap and water for at least 20 seconds. If soap and water are not available, use hand sanitizer. ?Avoid touching your nose, eyes, and mouth, especially if you have not washed your hands recently. ?If anyone in your household has a viral infection, clean all household surfaces that may have been in contact  with the virus. Use soap and hot water. You may also use bleach that you have added water to (diluted). ?Stay away from people who are sick with symptoms of a viral infection. ?Do not share items such a

## 2021-10-30 ENCOUNTER — Telehealth: Payer: Medicaid Other | Admitting: Physician Assistant

## 2021-10-30 DIAGNOSIS — F4321 Adjustment disorder with depressed mood: Secondary | ICD-10-CM

## 2021-10-30 DIAGNOSIS — F418 Other specified anxiety disorders: Secondary | ICD-10-CM

## 2021-10-30 NOTE — Progress Notes (Signed)
?Virtual Visit Consent  ? ?Lori Raymond, you are scheduled for a virtual visit with a [facility] provider today.   ?  ?Just as with appointments in the office, your consent must be obtained to participate.  Your consent will be active for this visit and any virtual visit you may have with one of our providers in the next 365 days.   ?  ?If you have a MyChart account, a copy of this consent can be sent to you electronically.  All virtual visits are billed to your insurance company just like a traditional visit in the office.   ? ?As this is a virtual visit, video technology does not allow for your provider to perform a traditional examination.  This may limit your provider's ability to fully assess your condition.  If your provider identifies any concerns that need to be evaluated in person or the need to arrange testing (such as labs, EKG, etc.), we will make arrangements to do so.   ?  ?Although advances in technology are sophisticated, we cannot ensure that it will always work on either your end or our end.  If the connection with a video visit is poor, the visit may have to be switched to a telephone visit.  With either a video or telephone visit, we are not always able to ensure that we have a secure connection.   ? ?Also, by engaging in this virtual visit, you consent to the provision of healthcare. Additionally, you authorize for your insurance to be billed (if applicable) for the services provided during this visit. I also discussed with the patient that there may be a patient responsible charge related to this service. ? ?I need to obtain your verbal consent now.   Are you willing to proceed with your visit today?  ?  ?Lorana Maffeo has provided verbal consent on 10/30/2021 for a virtual visit (video or telephone). ?  ?[name], PA-C  ? ?Date: 10/30/2021 1:53 PM ? ? ?Virtual Visit via Video Note  ? ?Lori Raymond, connected with  Kamry Faraci  ([id number], 1991/05/25) on 10/30/21 at   1:45 PM EDT by a video-enabled telemedicine application and verified that I am speaking with the correct person using two identifiers. ? ?Location: ?Patient: Virtual Visit Location Patient: Mobile ?Provider: Virtual Visit Location Provider: Home Office ?  ?I discussed the limitations of evaluation and management by telemedicine and the availability of in person appointments. The patient expressed understanding and agreed to proceed.   ? ?History of Present Illness: ?Lori Raymond is a 31 y.o. who identifies as a female who was assigned female at birth, and is being seen today for anxiety. Acute onset today. Had a recent death in the family. Does have a therapist and has an appt coming up on Thursday. Has never taking medication for anxiety. Does not wish to start medication at this time. Requesting a work note for tonight to try to rest instead.  ? ? ?Problems:  ?Patient Active Problem List  ? Diagnosis Date Noted  ? Herpes genitalia 05/26/2021  ?  ?Allergies:  ?Allergies  ?Allergen Reactions  ? Lexapro [Escitalopram Oxalate] Anaphylaxis  ? Estrogens   ?  stroke  ? Benadryl [Diphenhydramine] Palpitations  ? Latex Rash  ? Sulfa Antibiotics Rash  ? ?Medications:  ?Current Outpatient Medications:  ?  albuterol (VENTOLIN HFA) 108 (90 Base) MCG/ACT inhaler, Inhale 2 puffs into the lungs every 6 (six) hours as needed for wheezing or shortness of breath. (Patient  not taking: Reported on 08/17/2021), Disp: 1 each, Rfl: 2 ?  guaiFENesin-codeine (VIRTUSSIN A/C) 100-10 MG/5ML syrup, Take 5 mLs by mouth 3 (three) times daily as needed for cough. (Patient not taking: Reported on 08/12/2021), Disp: 120 mL, Rfl: 0 ?  lidocaine (LIDODERM) 5 %, Place 1 patch onto the skin every 12 (twelve) hours. Remove & Discard patch within 12 hours or as directed by MD (Patient not taking: Reported on 08/12/2021), Disp: 10 patch, Rfl: 0 ?  lidocaine (XYLOCAINE) 5 % ointment, Apply 1 application topically 3 (three) times daily as needed. (Patient  not taking: Reported on 08/12/2021), Disp: 35.44 g, Rfl: 0 ?  meloxicam (MOBIC) 15 MG tablet, Take 1 tablet (15 mg total) by mouth daily. (Patient not taking: Reported on 08/12/2021), Disp: 30 tablet, Rfl: 0 ?  naproxen (NAPROSYN) 375 MG tablet, Take 1 tablet (375 mg total) by mouth 2 (two) times daily with a meal. (Patient not taking: Reported on 08/12/2021), Disp: 20 tablet, Rfl: 0 ?  ondansetron (ZOFRAN-ODT) 4 MG disintegrating tablet, Take 1 tablet (4 mg total) by mouth every 8 (eight) hours as needed for nausea or vomiting., Disp: 20 tablet, Rfl: 0 ?  pantoprazole (PROTONIX) 40 MG tablet, Take 1 tablet (40 mg total) by mouth daily., Disp: 30 tablet, Rfl: 1 ?  predniSONE (STERAPRED UNI-PAK 21 TAB) 10 MG (21) TBPK tablet, Take 6 tablets on the first day and decrease by 1 tablet each day until finished. (Patient not taking: Reported on 08/17/2021), Disp: 21 tablet, Rfl: 0 ?  valACYclovir (VALTREX) 500 MG tablet, Take 500 mg by mouth 2 (two) times daily., Disp: , Rfl:  ? ?Observations/Objective: ?Patient is well-developed, well-nourished in no acute distress.  ?Resting comfortably  ?Head is normocephalic, atraumatic.  ?No labored breathing.  ?Speech is clear and coherent with logical content.  ?Patient is alert and oriented at baseline.  ? ? ?Assessment and Plan: ?1. Grief reaction ? ?2. Situational anxiety ? ?- Work note provided ?- Keep scheduled appt with therapist ?- Follow up if symptoms worsen ? ?Follow Up Instructions: ?I discussed the assessment and treatment plan with the patient. The patient was provided an opportunity to ask questions and all were answered. The patient agreed with the plan and demonstrated an understanding of the instructions.  A copy of instructions were sent to the patient via MyChart unless otherwise noted below.  ? ? ?The patient was advised to call back or seek an in-person evaluation if the symptoms worsen or if the condition fails to improve as anticipated. ? ?Time:  ?I spent 12  minutes with the patient via telehealth technology discussing the above problems/concerns.   ? ?[name], PA-C ?

## 2021-10-30 NOTE — Patient Instructions (Signed)
?[name], thank you for joining [name], PA-C for today's virtual visit.  While this provider is not your primary care provider (PCP), if your PCP is located in our provider database this encounter information will be shared with them immediately following your visit. ? ?Consent: ?(Patient) Lori Raymond provided verbal consent for this virtual visit at the beginning of the encounter. ? ?Current Medications: ? ?Current Outpatient Medications:  ?  albuterol (VENTOLIN HFA) 108 (90 Base) MCG/ACT inhaler, Inhale 2 puffs into the lungs every 6 (six) hours as needed for wheezing or shortness of breath. (Patient not taking: Reported on 08/17/2021), Disp: 1 each, Rfl: 2 ?  guaiFENesin-codeine (VIRTUSSIN A/C) 100-10 MG/5ML syrup, Take 5 mLs by mouth 3 (three) times daily as needed for cough. (Patient not taking: Reported on 08/12/2021), Disp: 120 mL, Rfl: 0 ?  lidocaine (LIDODERM) 5 %, Place 1 patch onto the skin every 12 (twelve) hours. Remove & Discard patch within 12 hours or as directed by MD (Patient not taking: Reported on 08/12/2021), Disp: 10 patch, Rfl: 0 ?  lidocaine (XYLOCAINE) 5 % ointment, Apply 1 application topically 3 (three) times daily as needed. (Patient not taking: Reported on 08/12/2021), Disp: 35.44 g, Rfl: 0 ?  meloxicam (MOBIC) 15 MG tablet, Take 1 tablet (15 mg total) by mouth daily. (Patient not taking: Reported on 08/12/2021), Disp: 30 tablet, Rfl: 0 ?  naproxen (NAPROSYN) 375 MG tablet, Take 1 tablet (375 mg total) by mouth 2 (two) times daily with a meal. (Patient not taking: Reported on 08/12/2021), Disp: 20 tablet, Rfl: 0 ?  ondansetron (ZOFRAN-ODT) 4 MG disintegrating tablet, Take 1 tablet (4 mg total) by mouth every 8 (eight) hours as needed for nausea or vomiting., Disp: 20 tablet, Rfl: 0 ?  pantoprazole (PROTONIX) 40 MG tablet, Take 1 tablet (40 mg total) by mouth daily., Disp: 30 tablet, Rfl: 1 ?  predniSONE (STERAPRED UNI-PAK 21 TAB) 10 MG (21) TBPK tablet, Take 6 tablets  on the first day and decrease by 1 tablet each day until finished. (Patient not taking: Reported on 08/17/2021), Disp: 21 tablet, Rfl: 0 ?  valACYclovir (VALTREX) 500 MG tablet, Take 500 mg by mouth 2 (two) times daily., Disp: , Rfl:   ? ?Medications ordered in this encounter:  ?No orders of the defined types were placed in this encounter. ?  ? ?*If you need refills on other medications prior to your next appointment, please contact your pharmacy* ? ?Follow-Up: ?Call back or seek an in-person evaluation if the symptoms worsen or if the condition fails to improve as anticipated. ? ?Other Instructions ?Managing Loss, Adult ?People experience loss in many different ways throughout their lives. Events such as moving, changing jobs, and losing friends can create a sense of loss. The loss may be as serious as a major health change, divorce, death of a pet, or death of a loved one. All of these types of loss are likely to create a physical and emotional reaction known as grief. Grief is the result of a major change or an absence of something or someone that you count on. Grief is a normal reaction to loss. ?A variety of factors can affect your grieving experience, including: ?The nature of your loss. ?Your relationship to what or whom you lost. ?Your understanding of grief and how to manage it. ?Your support system. ?Be aware that when grief becomes extreme, it can lead to more severe issues like isolation, depression, anxiety, or suicidal thoughts. Talk with your health care  provider if you have any of these issues. ?How to manage lifestyle changes ?Keep to your normal routine as much as possible. ?If you have trouble focusing or doing normal activities, it is acceptable to take some time away from your normal routine. ?Spend time with friends and loved ones. ?Eat a healthy diet, get plenty of sleep, and rest when you feel tired. ?How to recognize changes  ?The way that you deal with your grief will affect your ability to  function as you normally do. When grieving, you may experience these changes: ?Numbness, shock, sadness, anxiety, anger, denial, and guilt. ?Thoughts about death. ?Unexpected crying. ?A physical sensation of emptiness in your stomach. ?Problems sleeping and eating. ?Tiredness (fatigue). ?Loss of interest in normal activities. ?Dreaming about or imagining seeing the person who died. ?A need to remember what or whom you lost. ?Difficulty thinking about anything other than your loss for a period of time. ?Relief. If you have been expecting the loss for a while, you may feel a sense of relief when it happens. ?Follow these instructions at home: ?Activity ?Express your feelings in healthy ways, such as: ?Talking with others about your loss. It may be helpful to find others who have had a similar loss, such as a support group. ?Writing down your feelings in a journal. ?Doing physical activities to release stress and emotional energy. ?Doing creative activities like painting, sculpting, or playing or listening to music. ?Practicing resilience. This is the ability to recover and adjust after facing challenges. Reading some resources that encourage resilience may help you to learn ways to practice those behaviors. ? ?General instructions ?Be patient with yourself and others. Allow the grieving process to happen, and remember that grieving takes time. ?It is likely that you may never feel completely done with some grief. You may find a way to move on while still cherishing memories and feelings about your loss. ?Accepting your loss is a process. It can take months or longer to adjust. ?Keep all follow-up visits. This is important. ?Where to find support ?To get support for managing loss: ?Ask your health care provider for help and recommendations, such as grief counseling or therapy. ?Think about joining a support group for people who are managing a loss. ?Where to find more information ?You can find more information about  managing loss from: ?American Society of Clinical Oncology: www.cancer.net ?American Psychological Association: [employer] ?Contact a health care provider if: ?Your grief is extreme and keeps getting worse. ?You have ongoing grief that does not improve. ?Your body shows symptoms of grief, such as illness. ?You feel depressed, anxious, or hopeless. ?Get help right away if: ?You have thoughts about hurting yourself or others. ?Get help right away if you feel like you may hurt yourself or others, or have thoughts about taking your own life. Go to your nearest emergency room or: ?Call 911. ?Call the National Suicide Prevention Lifeline at (330)646-4110 or 988. This is open 24 hours a day. ?Text the Crisis Text Line at 947-796-8635. ?Summary ?Grief is the result of a major change or an absence of someone or something that you count on. Grief is a normal reaction to loss. ?The depth of grief and the period of recovery depend on the type of loss and your ability to adjust to the change and process your feelings. ?Processing grief requires patience and a willingness to accept your feelings and talk about your loss with people who are supportive. ?It is important to find resources that work for you  and to realize that people experience grief differently. There is not one grieving process that works for everyone in the same way. ?Be aware that when grief becomes extreme, it can lead to more severe issues like isolation, depression, anxiety, or suicidal thoughts. Talk with your health care provider if you have any of these issues. ?This information is not intended to replace advice given to you by your health care provider. Make sure you discuss any questions you have with your health care provider. ?Document Revised: 02/06/2021 Document Reviewed: 02/06/2021 ?Elsevier Patient Education ? 2023 Elsevier Inc. ? ? ? ?If you have been instructed to have an in-person evaluation today at a local Urgent Care facility, please use the link  below. It will take you to a list of all of our available [facility], including address, phone number and hours of operation. Please do not delay care.  ?[facility] ? ?If yo

## 2021-11-14 ENCOUNTER — Ambulatory Visit: Payer: Self-pay | Admitting: Gastroenterology

## 2021-11-14 ENCOUNTER — Other Ambulatory Visit: Payer: Self-pay

## 2021-12-01 ENCOUNTER — Telehealth: Payer: Medicaid Other | Admitting: Physician Assistant

## 2021-12-01 DIAGNOSIS — F321 Major depressive disorder, single episode, moderate: Secondary | ICD-10-CM | POA: Diagnosis not present

## 2021-12-01 MED ORDER — SERTRALINE HCL 50 MG PO TABS: ORAL_TABLET | ORAL | 0 refills | Status: DC

## 2021-12-01 NOTE — Progress Notes (Signed)
Virtual Visit Consent   Lori Raymond, you are scheduled for a virtual visit with a [facility] provider today. Just as with appointments in the office, your consent must be obtained to participate. Your consent will be active for this visit and any virtual visit you may have with one of our providers in the next 365 days. If you have a MyChart account, a copy of this consent can be sent to you electronically.  As this is a virtual visit, video technology does not allow for your provider to perform a traditional examination. This may limit your provider's ability to fully assess your condition. If your provider identifies any concerns that need to be evaluated in person or the need to arrange testing (such as labs, EKG, etc.), we will make arrangements to do so. Although advances in technology are sophisticated, we cannot ensure that it will always work on either your end or our end. If the connection with a video visit is poor, the visit may have to be switched to a telephone visit. With either a video or telephone visit, we are not always able to ensure that we have a secure connection.  By engaging in this virtual visit, you consent to the provision of healthcare and authorize for your insurance to be billed (if applicable) for the services provided during this visit. Depending on your insurance coverage, you may receive a charge related to this service.  I need to obtain your verbal consent now. Are you willing to proceed with your visit today? Lori Raymond has provided verbal consent on 12/01/2021 for a virtual visit (video or telephone). Lori Loveless, PA-C  Date: 12/01/2021 5:15 PM  Virtual Visit via Video Note   I, Lori Raymond, connected with  Lori Raymond  ([id number][birth year]) on 12/01/21 at  5:00 PM EDT by a video-enabled telemedicine application and verified that I am speaking with the correct person using two identifiers.  Location: Patient: Virtual Visit Location  Patient: Home Provider: Virtual Visit Location Provider: Home Office   I discussed the limitations of evaluation and management by telemedicine and the availability of in person appointments. The patient expressed understanding and agreed to proceed.    History of Present Illness: Lori Raymond is a 31 y.o. who identifies as a female who was assigned female at birth, and is being seen today for anxiety and depression. Feels like she cannot focus, crying easily, does not want to do things she once enjoyed, having difficulties with sleep. She has been having these symptoms worsen acutely. Did have IUD removed in February 2023 and feels it may be her hormones triggering her. She has been seeing a Veterinary surgeon and does have an appt with a psychiatrist on Tuesday.    Problems:  Patient Active Problem List   Diagnosis Date Noted   Herpes genitalia 05/26/2021    Allergies:  Allergies  Allergen Reactions   Lexapro [Escitalopram Oxalate] Anaphylaxis   Estrogens     stroke   Benadryl [Diphenhydramine] Palpitations   Latex Rash   Sulfa Antibiotics Rash   Medications:  Current Outpatient Medications:    sertraline (ZOLOFT) 50 MG tablet, Start 0.5 tab (25mg ) PO q hs x 1 week; the increase to 1 tab (50mg ) PO q hs, Disp: 30 tablet, Rfl: 0   albuterol (VENTOLIN HFA) 108 (90 Base) MCG/ACT inhaler, Inhale 2 puffs into the lungs every 6 (six) hours as needed for wheezing or shortness of breath. (Patient not taking: Reported on 08/17/2021), Disp: 1 each, Rfl:  2   guaiFENesin-codeine (VIRTUSSIN A/C) 100-10 MG/5ML syrup, Take 5 mLs by mouth 3 (three) times daily as needed for cough. (Patient not taking: Reported on 08/12/2021), Disp: 120 mL, Rfl: 0   lidocaine (LIDODERM) 5 %, Place 1 patch onto the skin every 12 (twelve) hours. Remove & Discard patch within 12 hours or as directed by MD (Patient not taking: Reported on 08/12/2021), Disp: 10 patch, Rfl: 0   lidocaine (XYLOCAINE) 5 % ointment, Apply 1 application  topically 3 (three) times daily as needed. (Patient not taking: Reported on 08/12/2021), Disp: 35.44 g, Rfl: 0   meloxicam (MOBIC) 15 MG tablet, Take 1 tablet (15 mg total) by mouth daily. (Patient not taking: Reported on 08/12/2021), Disp: 30 tablet, Rfl: 0   naproxen (NAPROSYN) 375 MG tablet, Take 1 tablet (375 mg total) by mouth 2 (two) times daily with a meal. (Patient not taking: Reported on 08/12/2021), Disp: 20 tablet, Rfl: 0   ondansetron (ZOFRAN-ODT) 4 MG disintegrating tablet, Take 1 tablet (4 mg total) by mouth every 8 (eight) hours as needed for nausea or vomiting., Disp: 20 tablet, Rfl: 0   pantoprazole (PROTONIX) 40 MG tablet, Take 1 tablet (40 mg total) by mouth daily., Disp: 30 tablet, Rfl: 1   predniSONE (STERAPRED UNI-PAK 21 TAB) 10 MG (21) TBPK tablet, Take 6 tablets on the first day and decrease by 1 tablet each day until finished. (Patient not taking: Reported on 08/17/2021), Disp: 21 tablet, Rfl: 0   promethazine (PHENERGAN) 25 MG tablet, Take by mouth., Disp: , Rfl:    valACYclovir (VALTREX) 500 MG tablet, Take 500 mg by mouth 2 (two) times daily., Disp: , Rfl:   Observations/Objective: Patient is well-developed, well-nourished in no acute distress.  Resting comfortably at home.  Head is normocephalic, atraumatic.  No labored breathing.  Speech is clear and coherent with logical content.  Patient is alert and oriented at baseline.    Assessment and Plan: 1. Depression, major, single episode, moderate (HCC) - sertraline (ZOLOFT) 50 MG tablet; Start 0.5 tab (25mg ) PO q hs x 1 week; the increase to 1 tab (50mg ) PO q hs  Dispense: 30 tablet; Refill: 0  - Will start Sertraline - Keep scheduled appt next week - Work note provided for tomorrow - Seek in person evaluation if symptoms worsen or fail to improve  Follow Up Instructions: I discussed the assessment and treatment plan with the patient. The patient was provided an opportunity to ask questions and all were answered.  The patient agreed with the plan and demonstrated an understanding of the instructions.  A copy of instructions were sent to the patient via MyChart unless otherwise noted below.    The patient was advised to call back or seek an in-person evaluation if the symptoms worsen or if the condition fails to improve as anticipated.  Time:  I spent 15 minutes with the patient via telehealth technology discussing the above problems/concerns.    , PA-C

## 2021-12-01 NOTE — Patient Instructions (Signed)
Lori Raymond, thank you for joining [name], PA-C for today's virtual visit.  While this provider is not your primary care provider (PCP), if your PCP is located in our provider database this encounter information will be shared with them immediately following your visit.  Consent: (Patient) Lori Raymond provided verbal consent for this virtual visit at the beginning of the encounter.  Current Medications:  Current Outpatient Medications:    sertraline (ZOLOFT) 50 MG tablet, Start 0.5 tab (25mg ) PO q hs x 1 week; the increase to 1 tab (50mg ) PO q hs, Disp: 30 tablet, Rfl: 0   albuterol (VENTOLIN HFA) 108 (90 Base) MCG/ACT inhaler, Inhale 2 puffs into the lungs every 6 (six) hours as needed for wheezing or shortness of breath. (Patient not taking: Reported on 08/17/2021), Disp: 1 each, Rfl: 2   guaiFENesin-codeine (VIRTUSSIN A/C) 100-10 MG/5ML syrup, Take 5 mLs by mouth 3 (three) times daily as needed for cough. (Patient not taking: Reported on 08/12/2021), Disp: 120 mL, Rfl: 0   lidocaine (LIDODERM) 5 %, Place 1 patch onto the skin every 12 (twelve) hours. Remove & Discard patch within 12 hours or as directed by MD (Patient not taking: Reported on 08/12/2021), Disp: 10 patch, Rfl: 0   lidocaine (XYLOCAINE) 5 % ointment, Apply 1 application topically 3 (three) times daily as needed. (Patient not taking: Reported on 08/12/2021), Disp: 35.44 g, Rfl: 0   meloxicam (MOBIC) 15 MG tablet, Take 1 tablet (15 mg total) by mouth daily. (Patient not taking: Reported on 08/12/2021), Disp: 30 tablet, Rfl: 0   naproxen (NAPROSYN) 375 MG tablet, Take 1 tablet (375 mg total) by mouth 2 (two) times daily with a meal. (Patient not taking: Reported on 08/12/2021), Disp: 20 tablet, Rfl: 0   ondansetron (ZOFRAN-ODT) 4 MG disintegrating tablet, Take 1 tablet (4 mg total) by mouth every 8 (eight) hours as needed for nausea or vomiting., Disp: 20 tablet, Rfl: 0   pantoprazole (PROTONIX) 40 MG tablet, Take 1  tablet (40 mg total) by mouth daily., Disp: 30 tablet, Rfl: 1   predniSONE (STERAPRED UNI-PAK 21 TAB) 10 MG (21) TBPK tablet, Take 6 tablets on the first day and decrease by 1 tablet each day until finished. (Patient not taking: Reported on 08/17/2021), Disp: 21 tablet, Rfl: 0   promethazine (PHENERGAN) 25 MG tablet, Take by mouth., Disp: , Rfl:    valACYclovir (VALTREX) 500 MG tablet, Take 500 mg by mouth 2 (two) times daily., Disp: , Rfl:    Medications ordered in this encounter:  Meds ordered this encounter  Medications   sertraline (ZOLOFT) 50 MG tablet    Sig: Start 0.5 tab (25mg ) PO q hs x 1 week; the increase to 1 tab (50mg ) PO q hs    Dispense:  30 tablet    Refill:  0    Order Specific Question:   Supervising Provider    Answer:   10/10/2021 [[id number]]     *If you need refills on other medications prior to your next appointment, please contact your pharmacy*  Follow-Up: Call back or seek an in-person evaluation if the symptoms worsen or if the condition fails to improve as anticipated.  Other Instructions Sertraline Tablets What is this medication? SERTRALINE (SER tra leen) treats depression, anxiety, obsessive-compulsive disorder (OCD), post-traumatic stress disorder (PTSD), and premenstrual dysphoric disorder (PMDD). It increases the amount of serotonin in the brain, a hormone that helps regulate mood. It belongs to a group of medications called SSRIs. This medicine may  be used for other purposes; ask your health care provider or pharmacist if you have questions. COMMON BRAND NAME(S): Zoloft What should I tell my care team before I take this medication? They need to know if you have any of these conditions: Bleeding disorders Bipolar disorder or a family history of bipolar disorder Frequently drink alcohol Glaucoma Heart disease High blood pressure History of irregular heartbeat History of low levels of calcium, magnesium, or potassium in the blood Liver  disease Receiving electroconvulsive therapy Seizures Suicidal thoughts, plans, or attempt; a previous suicide attempt by you or a family member Take medications that prevent or treat blood clots Thyroid disease An unusual or allergic reaction to sertraline, other medications, foods, dyes, or preservatives Pregnant or trying to get pregnant Breast-feeding How should I use this medication? Take this medication by mouth with a glass of water. Follow the directions on the prescription label. You can take it with or without food. Take your medication at regular intervals. Do not take your medication more often than directed. Do not stop taking this medication suddenly except upon the advice of your care team. Stopping this medication too quickly may cause serious side effects or your condition may worsen. A special MedGuide will be given to you by the pharmacist with each prescription and refill. Be sure to read this information carefully each time. Talk to your care team about the use of this medication in children. While this medication may be prescribed for children as young as 7 years for selected conditions, precautions do apply. Overdosage: If you think you have taken too much of this medicine contact a poison control center or emergency room at once. NOTE: This medicine is only for you. Do not share this medicine with others. What if I miss a dose? If you miss a dose, take it as soon as you can. If it is almost time for your next dose, take only that dose. Do not take double or extra doses. What may interact with this medication? Do not take this medication with any of the following: Cisapride Dronedarone Linezolid MAOIs like Carbex, Eldepryl, Marplan, Nardil, and Parnate Methylene blue (injected into a vein) Pimozide Thioridazine This medication may also interact with the following: Alcohol Amphetamines Aspirin and aspirin-like medications Certain medications for depression, anxiety,  or other mental health conditions Certain medications for fungal infections like ketoconazole, fluconazole, posaconazole, and itraconazole Certain medications for irregular heart beat like flecainide, quinidine, propafenone Certain medications for migraine headaches like almotriptan, eletriptan, frovatriptan, naratriptan, rizatriptan, sumatriptan, zolmitriptan Certain medications for sleep Certain medications for seizures like carbamazepine, valproic acid, phenytoin Certain medications that treat or prevent blood clots like warfarin, enoxaparin, dalteparin Cimetidine Digoxin Diuretics Fentanyl Isoniazid Lithium NSAIDs, medications for pain and inflammation, like ibuprofen or naproxen Other medications that prolong the QT interval (cause an abnormal heart rhythm) like dofetilide Rasagiline Safinamide Supplements like St. John's wort, kava kava, valerian Tolbutamide Tramadol Tryptophan This list may not describe all possible interactions. Give your health care provider a list of all the medicines, herbs, non-prescription drugs, or dietary supplements you use. Also tell them if you smoke, drink alcohol, or use illegal drugs. Some items may interact with your medicine. What should I watch for while using this medication? Tell your care team if your symptoms do not get better or if they get worse. Visit your care team for regular checks on your progress. Because it may take several weeks to see the full effects of this medication, it is important to continue  your treatment as prescribed by your care team. Patients and their families should watch out for new or worsening thoughts of suicide or depression. Also watch out for sudden changes in feelings such as feeling anxious, agitated, panicky, irritable, hostile, aggressive, impulsive, severely restless, overly excited and hyperactive, or not being able to sleep. If this happens, especially at the beginning of treatment or after a change in dose,  call your care team. This medication may affect your coordination, reaction time, or judgment. Do not drive or operate machinery until you know how this medication affects you. Sit or stand up slowly to reduce the risk of dizzy or fainting spells. Drinking alcohol with this medication can increase the risk of these side effects. Your mouth may get dry. Chewing sugarless gum or sucking hard candy, and drinking plenty of water may help. Contact your care team if the problem does not go away or is severe. What side effects may I notice from receiving this medication? Side effects that you should report to your care team as soon as possible: Allergic reactions--skin rash, itching, hives, swelling of the face, lips, tongue, or throat Bleeding--bloody or black, tar-like stools, red or dark brown urine, vomiting blood or brown material that looks like coffee grounds, small red or purple spots on skin, unusual bleeding or bruising Heart rhythm changes--fast or irregular heartbeat, dizziness, feeling faint or lightheaded, chest pain, trouble breathing Low sodium level--muscle weakness, fatigue, dizziness, headache, confusion Serotonin syndrome--irritability, confusion, fast or irregular heartbeat, muscle stiffness, twitching muscles, sweating, high fever, seizure, chills, vomiting, diarrhea Sudden eye pain or change in vision such as blurred vision, seeing halos around lights, vision loss Thoughts of suicide or self-harm, worsening mood Side effects that usually do not require medical attention (report these to your care team if they continue or are bothersome): Change in sex drive or performance Diarrhea Excessive sweating Nausea Tremors or shaking Upset stomach This list may not describe all possible side effects. Call your doctor for medical advice about side effects. You may report side effects to FDA at 1-800-FDA-1088. Where should I keep my medication? Keep out of the reach of children and  pets. Store at room temperature between 15 and 30 degrees C (59 and 86 degrees F). Get rid of any unused medication after the expiration date. To get rid of medications that are no longer needed or expired: Take the medication to a medication take-back program. Check with your pharmacy or law enforcement to find a location. If you cannot return the medication, check the label or package insert to see if the medication should be thrown out in the garbage or flushed down the toilet. If you are not sure, ask your care team. If it is safe to put in the trash, empty the medication out of the container. Mix the medication with cat litter, dirt, coffee grounds, or other unwanted substance. Seal the mixture in a bag or container. Put it in the trash. NOTE: This sheet is a summary. It may not cover all possible information. If you have questions about this medicine, talk to your doctor, pharmacist, or health care provider.  2023 Elsevier/Gold Standard (2020-12-19 00:00:00)    If you have been instructed to have an in-person evaluation today at a local Urgent Care facility, please use the link below. It will take you to a list of all of our available [facility], including address, phone number and hours of operation. Please do not delay care.  [facility]  If you or a family member do not have a primary care provider, use the link below to schedule a visit and establish care. When you choose a [facility] primary care physician or advanced practice provider, you gain a [facility]-term partner in health. Find a Primary Care Provider  Learn more about [facility]'s in-office and virtual care options: [facility]

## 2022-01-01 ENCOUNTER — Telehealth: Payer: Medicaid Other | Admitting: Physician Assistant

## 2022-01-01 DIAGNOSIS — B9689 Other specified bacterial agents as the cause of diseases classified elsewhere: Secondary | ICD-10-CM | POA: Diagnosis not present

## 2022-01-01 DIAGNOSIS — T3695XA Adverse effect of unspecified systemic antibiotic, initial encounter: Secondary | ICD-10-CM

## 2022-01-01 DIAGNOSIS — J028 Acute pharyngitis due to other specified organisms: Secondary | ICD-10-CM

## 2022-01-01 DIAGNOSIS — B379 Candidiasis, unspecified: Secondary | ICD-10-CM

## 2022-01-01 MED ORDER — FLUCONAZOLE 150 MG PO TABS: 150.0000 mg | ORAL_TABLET | ORAL | 0 refills | Status: DC | PRN

## 2022-01-01 MED ORDER — AZITHROMYCIN 250 MG PO TABS: ORAL_TABLET | ORAL | 0 refills | Status: AC

## 2022-01-01 NOTE — Progress Notes (Signed)
Virtual Visit Consent   Lori Raymond, you are scheduled for a virtual visit with a [facility] provider today. Just as with appointments in the office, your consent must be obtained to participate. Your consent will be active for this visit and any virtual visit you may have with one of our providers in the next 365 days. If you have a MyChart account, a copy of this consent can be sent to you electronically.  As this is a virtual visit, video technology does not allow for your provider to perform a traditional examination. This may limit your provider's ability to fully assess your condition. If your provider identifies any concerns that need to be evaluated in person or the need to arrange testing (such as labs, EKG, etc.), we will make arrangements to do so. Although advances in technology are sophisticated, we cannot ensure that it will always work on either your end or our end. If the connection with a video visit is poor, the visit may have to be switched to a telephone visit. With either a video or telephone visit, we are not always able to ensure that we have a secure connection.  By engaging in this virtual visit, you consent to the provision of healthcare and authorize for your insurance to be billed (if applicable) for the services provided during this visit. Depending on your insurance coverage, you may receive a charge related to this service.  I need to obtain your verbal consent now. Are you willing to proceed with your visit today? Lori Raymond has provided verbal consent on 01/01/2022 for a virtual visit (video or telephone). Lori Loveless, PA-C  Date: 01/01/2022 4:01 PM  Virtual Visit via Video Note   I, Lori Raymond, connected with  Lori Raymond  ([id number], 1991-09-29) on 01/01/22 at  4:00 PM EDT by a video-enabled telemedicine application and verified that I am speaking with the correct person using two identifiers.  Location: Patient: Virtual Visit Location  Patient: Home Provider: Virtual Visit Location Provider: Home Office   I discussed the limitations of evaluation and management by telemedicine and the availability of in person appointments. The patient expressed understanding and agreed to proceed.    History of Present Illness: Lori Raymond is a 31 y.o. who identifies as a female who was assigned female at birth, and is being seen today for URI symptoms.  HPI: URI  This is a new problem. The current episode started 1 to 4 weeks ago (worsened over last 2-3 days). The problem has been gradually worsening. There has been no fever. Associated symptoms include a sore throat and swollen glands. Associated symptoms comments: Swollen uvula, decerased appetite.   At home Covid testing is negative. Boyfriend and daughter both had similar symptoms.    Problems:  Patient Active Problem List   Diagnosis Date Noted   Herpes genitalia 05/26/2021    Allergies:  Allergies  Allergen Reactions   Lexapro [Escitalopram Oxalate] Anaphylaxis   Estrogens     stroke   Benadryl [Diphenhydramine] Palpitations   Latex Rash   Sulfa Antibiotics Rash   Medications:  Current Outpatient Medications:    azithromycin (ZITHROMAX) 250 MG tablet, Take 2 tablets on day 1, then 1 tablet daily on days 2 through 5, Disp: 6 tablet, Rfl: 0   fluconazole (DIFLUCAN) 150 MG tablet, Take 1 tablet (150 mg total) by mouth every 3 (three) days as needed., Disp: 2 tablet, Rfl: 0   albuterol (VENTOLIN HFA) 108 (90 Base) MCG/ACT inhaler, Inhale 2  puffs into the lungs every 6 (six) hours as needed for wheezing or shortness of breath. (Patient not taking: Reported on 08/17/2021), Disp: 1 each, Rfl: 2   guaiFENesin-codeine (VIRTUSSIN A/C) 100-10 MG/5ML syrup, Take 5 mLs by mouth 3 (three) times daily as needed for cough. (Patient not taking: Reported on 08/12/2021), Disp: 120 mL, Rfl: 0   lidocaine (LIDODERM) 5 %, Place 1 patch onto the skin every 12 (twelve) hours. Remove & Discard  patch within 12 hours or as directed by MD (Patient not taking: Reported on 08/12/2021), Disp: 10 patch, Rfl: 0   lidocaine (XYLOCAINE) 5 % ointment, Apply 1 application topically 3 (three) times daily as needed. (Patient not taking: Reported on 08/12/2021), Disp: 35.44 g, Rfl: 0   meloxicam (MOBIC) 15 MG tablet, Take 1 tablet (15 mg total) by mouth daily. (Patient not taking: Reported on 08/12/2021), Disp: 30 tablet, Rfl: 0   naproxen (NAPROSYN) 375 MG tablet, Take 1 tablet (375 mg total) by mouth 2 (two) times daily with a meal. (Patient not taking: Reported on 08/12/2021), Disp: 20 tablet, Rfl: 0   ondansetron (ZOFRAN-ODT) 4 MG disintegrating tablet, Take 1 tablet (4 mg total) by mouth every 8 (eight) hours as needed for nausea or vomiting., Disp: 20 tablet, Rfl: 0   pantoprazole (PROTONIX) 40 MG tablet, Take 1 tablet (40 mg total) by mouth daily., Disp: 30 tablet, Rfl: 1   predniSONE (STERAPRED UNI-PAK 21 TAB) 10 MG (21) TBPK tablet, Take 6 tablets on the first day and decrease by 1 tablet each day until finished. (Patient not taking: Reported on 08/17/2021), Disp: 21 tablet, Rfl: 0   promethazine (PHENERGAN) 25 MG tablet, Take by mouth., Disp: , Rfl:    sertraline (ZOLOFT) 50 MG tablet, Start 0.5 tab (25mg ) PO q hs x 1 week; the increase to 1 tab (50mg ) PO q hs, Disp: 30 tablet, Rfl: 0   valACYclovir (VALTREX) 500 MG tablet, Take 500 mg by mouth 2 (two) times daily., Disp: , Rfl:   Observations/Objective: Patient is well-developed, well-nourished in no acute distress.  Resting comfortably at home.  Head is normocephalic, atraumatic.  No labored breathing.  Speech is clear and coherent with logical content.  Patient is alert and oriented at baseline.    Assessment and Plan: 1. Bacterial pharyngitis - azithromycin (ZITHROMAX) 250 MG tablet; Take 2 tablets on day 1, then 1 tablet daily on days 2 through 5  Dispense: 6 tablet; Refill: 0  2. Antibiotic-induced yeast infection - fluconazole  (DIFLUCAN) 150 MG tablet; Take 1 tablet (150 mg total) by mouth every 3 (three) days as needed.  Dispense: 2 tablet; Refill: 0  - Worsening symptoms that have not responded to OTC medications.  - Will give Azithromycin - Continue allergy medications.  - Steam and humidifier can help - Diflucan given as prophylaxis as patient tends to get vaginal yeast infections with antibiotic use. - Stay well hydrated and get plenty of rest.  - Seek in person evaluation if no symptom improvement or if symptoms worsen   Follow Up Instructions: I discussed the assessment and treatment plan with the patient. The patient was provided an opportunity to ask questions and all were answered. The patient agreed with the plan and demonstrated an understanding of the instructions.  A copy of instructions were sent to the patient via MyChart unless otherwise noted below.    The patient was advised to call back or seek an in-person evaluation if the symptoms worsen or if the condition fails to  improve as anticipated.  Time:  I spent 8 minutes with the patient via telehealth technology discussing the above problems/concerns.    Lori Loveless, PA-C

## 2022-01-01 NOTE — Patient Instructions (Signed)
Lori Raymond, thank you for joining [name], PA-C for today's virtual visit.  While this provider is not your primary care provider (PCP), if your PCP is located in our provider database this encounter information will be shared with them immediately following your visit.  Consent: (Patient) Lori Raymond provided verbal consent for this virtual visit at the beginning of the encounter.  Current Medications:  Current Outpatient Medications:    azithromycin (ZITHROMAX) 250 MG tablet, Take 2 tablets on day 1, then 1 tablet daily on days 2 through 5, Disp: 6 tablet, Rfl: 0   fluconazole (DIFLUCAN) 150 MG tablet, Take 1 tablet (150 mg total) by mouth every 3 (three) days as needed., Disp: 2 tablet, Rfl: 0   albuterol (VENTOLIN HFA) 108 (90 Base) MCG/ACT inhaler, Inhale 2 puffs into the lungs every 6 (six) hours as needed for wheezing or shortness of breath. (Patient not taking: Reported on 08/17/2021), Disp: 1 each, Rfl: 2   guaiFENesin-codeine (VIRTUSSIN A/C) 100-10 MG/5ML syrup, Take 5 mLs by mouth 3 (three) times daily as needed for cough. (Patient not taking: Reported on 08/12/2021), Disp: 120 mL, Rfl: 0   lidocaine (LIDODERM) 5 %, Place 1 patch onto the skin every 12 (twelve) hours. Remove & Discard patch within 12 hours or as directed by MD (Patient not taking: Reported on 08/12/2021), Disp: 10 patch, Rfl: 0   lidocaine (XYLOCAINE) 5 % ointment, Apply 1 application topically 3 (three) times daily as needed. (Patient not taking: Reported on 08/12/2021), Disp: 35.44 g, Rfl: 0   meloxicam (MOBIC) 15 MG tablet, Take 1 tablet (15 mg total) by mouth daily. (Patient not taking: Reported on 08/12/2021), Disp: 30 tablet, Rfl: 0   naproxen (NAPROSYN) 375 MG tablet, Take 1 tablet (375 mg total) by mouth 2 (two) times daily with a meal. (Patient not taking: Reported on 08/12/2021), Disp: 20 tablet, Rfl: 0   ondansetron (ZOFRAN-ODT) 4 MG disintegrating tablet, Take 1 tablet (4 mg total) by mouth  every 8 (eight) hours as needed for nausea or vomiting., Disp: 20 tablet, Rfl: 0   pantoprazole (PROTONIX) 40 MG tablet, Take 1 tablet (40 mg total) by mouth daily., Disp: 30 tablet, Rfl: 1   predniSONE (STERAPRED UNI-PAK 21 TAB) 10 MG (21) TBPK tablet, Take 6 tablets on the first day and decrease by 1 tablet each day until finished. (Patient not taking: Reported on 08/17/2021), Disp: 21 tablet, Rfl: 0   promethazine (PHENERGAN) 25 MG tablet, Take by mouth., Disp: , Rfl:    sertraline (ZOLOFT) 50 MG tablet, Start 0.5 tab (25mg ) PO q hs x 1 week; the increase to 1 tab (50mg ) PO q hs, Disp: 30 tablet, Rfl: 0   valACYclovir (VALTREX) 500 MG tablet, Take 500 mg by mouth 2 (two) times daily., Disp: , Rfl:    Medications ordered in this encounter:  Meds ordered this encounter  Medications   azithromycin (ZITHROMAX) 250 MG tablet    Sig: Take 2 tablets on day 1, then 1 tablet daily on days 2 through 5    Dispense:  6 tablet    Refill:  0    Order Specific Question:   Supervising Provider    Answer:   , BRIAN [[id number]]   fluconazole (DIFLUCAN) 150 MG tablet    Sig: Take 1 tablet (150 mg total) by mouth every 3 (three) days as needed.    Dispense:  2 tablet    Refill:  0    Order Specific Question:   Supervising Provider  Answer:   MILLER, [place]     *If you need refills on other medications prior to your next appointment, please contact your pharmacy*  Follow-Up: Call back or seek an in-person evaluation if the symptoms worsen or if the condition fails to improve as anticipated.  Other Instructions Pharyngitis  Pharyngitis is inflammation of the throat (pharynx). It is a very common cause of sore throat. Pharyngitis can be caused by a bacteria, but it is usually caused by a virus. Most cases of pharyngitis get better on their own without treatment. What are the causes? This condition may be caused by: Infection by viruses (viral). Viral pharyngitis spreads easily from person to  person (is contagious) through coughing, sneezing, and sharing of personal items or utensils such as cups, forks, spoons, and toothbrushes. Infection by bacteria (bacterial). Bacterial pharyngitis may be spread by touching the nose or face after coming in contact with the bacteria, or through close contact, such as kissing. Allergies. Allergies can cause buildup of mucus in the throat (post-nasal drip), leading to inflammation and irritation. Allergies can also cause blocked nasal passages, forcing breathing through the mouth, which dries and irritates the throat. What increases the risk? You are more likely to develop this condition if: You are 68-74 years old. You are exposed to crowded environments such as daycare, school, or dormitory living. You live in a cold climate. You have a weakened disease-fighting (immune) system. What are the signs or symptoms? Symptoms of this condition vary by the cause. Common symptoms of this condition include: Sore throat. Fatigue. Low-grade fever. Stuffy nose (nasal congestion) and cough. Headache. Other symptoms may include: Glands in the neck (lymph nodes) that are swollen. Skin rashes. Plaque-like film on the throat or tonsils. This is often a symptom of bacterial pharyngitis. Vomiting. Red, itchy eyes (conjunctivitis). Loss of appetite. Joint pain and muscle aches. Enlarged tonsils. How is this diagnosed? This condition may be diagnosed based on your medical history and a physical exam. Your health care provider will ask you questions about your illness and your symptoms. A swab of your throat may be done to check for bacteria (rapid strep test). Other lab tests may also be done, depending on the suspected cause, but these are rare. How is this treated? Many times, treatment is not needed for this condition. Pharyngitis usually gets better in 3-4 days without treatment. Bacterial pharyngitis may be treated with antibiotic medicines. Follow these  instructions at home: Medicines Take over-the-counter and prescription medicines only as told by your health care provider. If you were prescribed an antibiotic medicine, take it as told by your health care provider. Do not stop taking the antibiotic even if you start to feel better. Use throat sprays to soothe your throat as told by your health care provider. Children can get pharyngitis. Do not give your child aspirin because of the association with Reye's syndrome. Managing pain To help with pain, try: Sipping warm liquids, such as broth, herbal tea, or warm water. Eating or drinking cold or frozen liquids, such as frozen ice pops. Gargling with a mixture of salt and water 3-4 times a day or as needed. To make salt water, completely dissolve -1 tsp (3-6 g) of salt in 1 cup (237 mL) of warm water. Sucking on hard candy or throat lozenges. Putting a cool-mist humidifier in your bedroom at night to moisten the air. Sitting in the bathroom with the door closed for 5-10 minutes while you run hot water in the shower.  General instructions  Do not use any products that contain nicotine or tobacco. These products include cigarettes, chewing tobacco, and vaping devices, such as e-cigarettes. If you need help quitting, ask your health care provider. Rest as told by your health care provider. Drink enough fluid to keep your urine pale yellow. How is this prevented? To help prevent becoming infected or spreading infection: Wash your hands often with soap and water for at least 20 seconds. If soap and water are not available, use hand sanitizer. Do not touch your eyes, nose, or mouth with unwashed hands, and wash hands after touching these areas. Do not share cups or eating utensils. Avoid close contact with people who are sick. Contact a health care provider if: You have large, tender lumps in your neck. You have a rash. You cough up green, yellow-brown, or bloody mucus. Get help right away  if: Your neck becomes stiff. You drool or are unable to swallow liquids. You cannot drink or take medicines without vomiting. You have severe pain that does not go away, even after you take medicine. You have trouble breathing, and it is not caused by a stuffy nose. You have new pain and swelling in your joints such as the knees, ankles, wrists, or elbows. These symptoms may represent a serious problem that is an emergency. Do not wait to see if the symptoms will go away. Get medical help right away. Call your local emergency services (911 in the [place] Do not drive yourself to the hospital. Summary Pharyngitis is redness, pain, and swelling (inflammation) of the throat (pharynx). While pharyngitis can be caused by a bacteria, the most common causes are viral. Most cases of pharyngitis get better on their own without treatment. Bacterial pharyngitis is treated with antibiotic medicines. This information is not intended to replace advice given to you by your health care provider. Make sure you discuss any questions you have with your health care provider. Document Revised: 09/14/2020 Document Reviewed: 09/14/2020 Elsevier Patient Education  2023 [employer]    If you have been instructed to have an in-person evaluation today at a local Urgent Care facility, please use the link below. It will take you to a list of all of our available [facility], including address, phone number and hours of operation. Please do not delay care.  [facility]  If you or a family member do not have a primary care provider, use the link below to schedule a visit and establish care. When you choose a [facility] primary care physician or advanced practice provider, you gain a long-term partner in health. Find a Primary Care Provider  Learn more about [facility]'s in-office and virtual care options: [facility] Get Care Now

## 2022-01-18 ENCOUNTER — Other Ambulatory Visit (HOSPITAL_COMMUNITY): Payer: Self-pay | Admitting: Family Medicine

## 2022-01-18 ENCOUNTER — Other Ambulatory Visit: Payer: Self-pay | Admitting: Family Medicine

## 2022-01-18 DIAGNOSIS — R161 Splenomegaly, not elsewhere classified: Secondary | ICD-10-CM

## 2022-01-23 ENCOUNTER — Ambulatory Visit
Admission: RE | Admit: 2022-01-23 | Discharge: 2022-01-23 | Disposition: A | Discharge status: Home / Self Care | Payer: Medicaid Other | Source: Ambulatory Visit | Attending: Family Medicine | Admitting: Family Medicine

## 2022-01-23 ENCOUNTER — Other Ambulatory Visit: Payer: Self-pay | Admitting: Family Medicine

## 2022-01-23 DIAGNOSIS — R161 Splenomegaly, not elsewhere classified: Secondary | ICD-10-CM | POA: Diagnosis present

## 2022-01-23 DIAGNOSIS — R1902 Left upper quadrant abdominal swelling, mass and lump: Secondary | ICD-10-CM

## 2022-04-12 ENCOUNTER — Encounter: Payer: Self-pay | Admitting: Physician Assistant

## 2022-04-12 ENCOUNTER — Telehealth: Payer: Medicaid Other | Admitting: Physician Assistant

## 2022-04-12 DIAGNOSIS — J208 Acute bronchitis due to other specified organisms: Secondary | ICD-10-CM

## 2022-04-12 DIAGNOSIS — T3695XA Adverse effect of unspecified systemic antibiotic, initial encounter: Secondary | ICD-10-CM

## 2022-04-12 DIAGNOSIS — B9689 Other specified bacterial agents as the cause of diseases classified elsewhere: Secondary | ICD-10-CM | POA: Diagnosis not present

## 2022-04-12 DIAGNOSIS — B379 Candidiasis, unspecified: Secondary | ICD-10-CM | POA: Diagnosis not present

## 2022-04-12 MED ORDER — PREDNISONE 20 MG PO TABS: 40.0000 mg | ORAL_TABLET | Freq: Every day | ORAL | 0 refills | Status: DC

## 2022-04-12 MED ORDER — BENZONATATE 100 MG PO CAPS: 100.0000 mg | ORAL_CAPSULE | Freq: Three times a day (TID) | ORAL | 0 refills | Status: DC | PRN

## 2022-04-12 MED ORDER — FLUCONAZOLE 150 MG PO TABS: 150.0000 mg | ORAL_TABLET | ORAL | 0 refills | Status: DC | PRN

## 2022-04-12 MED ORDER — ALBUTEROL SULFATE HFA 108 (90 BASE) MCG/ACT IN AERS: 1.0000 | INHALATION_SPRAY | Freq: Four times a day (QID) | RESPIRATORY_TRACT | 0 refills | Status: DC | PRN

## 2022-04-12 MED ORDER — AZITHROMYCIN 250 MG PO TABS: ORAL_TABLET | ORAL | 0 refills | Status: AC

## 2022-04-12 MED ORDER — PSEUDOEPH-BROMPHEN-DM 30-2-10 MG/5ML PO SYRP: 5.0000 mL | ORAL_SOLUTION | Freq: Four times a day (QID) | ORAL | 0 refills | Status: DC | PRN

## 2022-04-12 NOTE — Progress Notes (Signed)
Virtual Visit Consent   Lori Raymond, you are scheduled for a virtual visit with a [facility] provider today. Just as with appointments in the office, your consent must be obtained to participate. Your consent will be active for this visit and any virtual visit you may have with one of our providers in the next 365 days. If you have a MyChart account, a copy of this consent can be sent to you electronically.  As this is a virtual visit, video technology does not allow for your provider to perform a traditional examination. This may limit your provider's ability to fully assess your condition. If your provider identifies any concerns that need to be evaluated in person or the need to arrange testing (such as labs, EKG, etc.), we will make arrangements to do so. Although advances in technology are sophisticated, we cannot ensure that it will always work on either your end or our end. If the connection with a video visit is poor, the visit may have to be switched to a telephone visit. With either a video or telephone visit, we are not always able to ensure that we have a secure connection.  By engaging in this virtual visit, you consent to the provision of healthcare and authorize for your insurance to be billed (if applicable) for the services provided during this visit. Depending on your insurance coverage, you may receive a charge related to this service.  I need to obtain your verbal consent now. Are you willing to proceed with your visit today? Lori Raymond has provided verbal consent on 04/12/2022 for a virtual visit (video or telephone). Lori Loveless, PA-C  Date: 04/12/2022 1:12 PM  Virtual Visit via Video Note   I, Lori Raymond, connected with  Lori Raymond  ([id number], [id number]) on 04/12/22 at  1:00 PM EDT by a video-enabled telemedicine application and verified that I am speaking with the correct person using two identifiers.  Location: Patient: Virtual Visit  Location Patient: Mobile Provider: Virtual Visit Location Provider: Home Office   I discussed the limitations of evaluation and management by telemedicine and the availability of in person appointments. The patient expressed understanding and agreed to proceed.    History of Present Illness: Lori Raymond is a 31 y.o. who identifies as a female who was assigned female at birth, and is being seen today for cough.  HPI: Cough This is a new problem. The current episode started 1 to 4 weeks ago (2 weeks). The problem has been waxing and waning. The problem occurs every few minutes. The cough is Productive of sputum and productive of purulent sputum. Associated symptoms include chills, a fever (subjective fevers), myalgias, nasal congestion, postnasal drip, rhinorrhea, a sore throat and sweats. Pertinent negatives include no ear congestion, ear pain or shortness of breath. Associated symptoms comments: Sinus pressure/pain. The symptoms are aggravated by lying down. She has tried OTC cough suppressant (elderberry, severe cold and allergy combination medication, claritin, motrin, steam) for the symptoms. The treatment provided no relief.  Was exposed to Croup from son over 2 weeks ago.     Problems:  Patient Active Problem List   Diagnosis Date Noted   Herpes genitalia 05/26/2021    Allergies:  Allergies  Allergen Reactions   Lexapro [Escitalopram Oxalate] Anaphylaxis   Estrogens     stroke   Benadryl [Diphenhydramine] Palpitations   Latex Rash   Sulfa Antibiotics Rash   Medications:  Current Outpatient Medications:    azithromycin (ZITHROMAX) 250 MG tablet, Take 2  tablets on day 1, then 1 tablet daily on days 2 through 5, Disp: 6 tablet, Rfl: 0   benzonatate (TESSALON) 100 MG capsule, Take 1 capsule (100 mg total) by mouth 3 (three) times daily as needed., Disp: 30 capsule, Rfl: 0   brompheniramine-pseudoephedrine-DM 30-2-10 MG/5ML syrup, Take 5 mLs by mouth 4 (four) times daily as  needed., Disp: 120 mL, Rfl: 0   predniSONE (DELTASONE) 20 MG tablet, Take 2 tablets (40 mg total) by mouth daily with breakfast., Disp: 10 tablet, Rfl: 0   albuterol (VENTOLIN HFA) 108 (90 Base) MCG/ACT inhaler, Inhale 1-2 puffs into the lungs every 6 (six) hours as needed for wheezing or shortness of breath., Disp: 8 g, Rfl: 0   fluconazole (DIFLUCAN) 150 MG tablet, Take 1 tablet (150 mg total) by mouth every 3 (three) days as needed., Disp: 2 tablet, Rfl: 0   pantoprazole (PROTONIX) 40 MG tablet, Take 1 tablet (40 mg total) by mouth daily., Disp: 30 tablet, Rfl: 1   promethazine (PHENERGAN) 25 MG tablet, Take by mouth., Disp: , Rfl:    valACYclovir (VALTREX) 500 MG tablet, Take 500 mg by mouth 2 (two) times daily., Disp: , Rfl:   Observations/Objective: Patient is well-developed, well-nourished in no acute distress.  Resting comfortably Head is normocephalic, atraumatic.  No labored breathing.  Speech is clear and coherent with logical content.  Patient is alert and oriented at baseline.    Assessment and Plan: 1. Acute bacterial bronchitis - azithromycin (ZITHROMAX) 250 MG tablet; Take 2 tablets on day 1, then 1 tablet daily on days 2 through 5  Dispense: 6 tablet; Refill: 0 - predniSONE (DELTASONE) 20 MG tablet; Take 2 tablets (40 mg total) by mouth daily with breakfast.  Dispense: 10 tablet; Refill: 0 - benzonatate (TESSALON) 100 MG capsule; Take 1 capsule (100 mg total) by mouth 3 (three) times daily as needed.  Dispense: 30 capsule; Refill: 0 - brompheniramine-pseudoephedrine-DM 30-2-10 MG/5ML syrup; Take 5 mLs by mouth 4 (four) times daily as needed.  Dispense: 120 mL; Refill: 0 - albuterol (VENTOLIN HFA) 108 (90 Base) MCG/ACT inhaler; Inhale 1-2 puffs into the lungs every 6 (six) hours as needed for wheezing or shortness of breath.  Dispense: 8 g; Refill: 0  2. Antibiotic-induced yeast infection - fluconazole (DIFLUCAN) 150 MG tablet; Take 1 tablet (150 mg total) by mouth every 3  (three) days as needed.  Dispense: 2 tablet; Refill: 0  - Worsening over a week despite OTC medications; has been having intermittent waxing and waning symptoms following Croup exposure; had period where she thought she was better but then over the last 2 days have worsened again; Suspect secondary bacterial infection - Will treat with Z-pack, Prednisone, Albuterol, Bromfed DM and tessalon perles - Can continue Mucinex  - Push fluids.  - Rest.  - Steam and humidifier can help - Diflucan given as prophylaxis as patient tends to get vaginal yeast infections with antibiotic use - Seek in person evaluation if worsening or symptoms fail to improve    Follow Up Instructions: I discussed the assessment and treatment plan with the patient. The patient was provided an opportunity to ask questions and all were answered. The patient agreed with the plan and demonstrated an understanding of the instructions.  A copy of instructions were sent to the patient via MyChart unless otherwise noted below.    The patient was advised to call back or seek an in-person evaluation if the symptoms worsen or if the condition fails to improve as  anticipated.  Time:  I spent 15 minutes with the patient via telehealth technology discussing the above problems/concerns.    Mar Daring, PA-C

## 2022-04-12 NOTE — Patient Instructions (Signed)
Lori Raymond, thank you for joining [name], PA-C for today's virtual visit.  While this provider is not your primary care provider (PCP), if your PCP is located in our provider database this encounter information will be shared with them immediately following your visit.  Consent: (Patient) Lori Raymond provided verbal consent for this virtual visit at the beginning of the encounter.  Current Medications:  Current Outpatient Medications:    azithromycin (ZITHROMAX) 250 MG tablet, Take 2 tablets on day 1, then 1 tablet daily on days 2 through 5, Disp: 6 tablet, Rfl: 0   benzonatate (TESSALON) 100 MG capsule, Take 1 capsule (100 mg total) by mouth 3 (three) times daily as needed., Disp: 30 capsule, Rfl: 0   brompheniramine-pseudoephedrine-DM 30-2-10 MG/5ML syrup, Take 5 mLs by mouth 4 (four) times daily as needed., Disp: 120 mL, Rfl: 0   predniSONE (DELTASONE) 20 MG tablet, Take 2 tablets (40 mg total) by mouth daily with breakfast., Disp: 10 tablet, Rfl: 0   albuterol (VENTOLIN HFA) 108 (90 Base) MCG/ACT inhaler, Inhale 1-2 puffs into the lungs every 6 (six) hours as needed for wheezing or shortness of breath., Disp: 8 g, Rfl: 0   fluconazole (DIFLUCAN) 150 MG tablet, Take 1 tablet (150 mg total) by mouth every 3 (three) days as needed., Disp: 2 tablet, Rfl: 0   pantoprazole (PROTONIX) 40 MG tablet, Take 1 tablet (40 mg total) by mouth daily., Disp: 30 tablet, Rfl: 1   promethazine (PHENERGAN) 25 MG tablet, Take by mouth., Disp: , Rfl:    valACYclovir (VALTREX) 500 MG tablet, Take 500 mg by mouth 2 (two) times daily., Disp: , Rfl:    Medications ordered in this encounter:  Meds ordered this encounter  Medications   azithromycin (ZITHROMAX) 250 MG tablet    Sig: Take 2 tablets on day 1, then 1 tablet daily on days 2 through 5    Dispense:  6 tablet    Refill:  0    Order Specific Question:   Supervising Provider    Answer:   Merrilee Jansky [[id number]]   predniSONE  (DELTASONE) 20 MG tablet    Sig: Take 2 tablets (40 mg total) by mouth daily with breakfast.    Dispense:  10 tablet    Refill:  0    Order Specific Question:   Supervising Provider    Answer:   Merrilee Jansky [[id number]]   benzonatate (TESSALON) 100 MG capsule    Sig: Take 1 capsule (100 mg total) by mouth 3 (three) times daily as needed.    Dispense:  30 capsule    Refill:  0    Order Specific Question:   Supervising Provider    Answer:   Merrilee Jansky [id number]   brompheniramine-pseudoephedrine-DM 30-2-10 MG/5ML syrup    Sig: Take 5 mLs by mouth 4 (four) times daily as needed.    Dispense:  120 mL    Refill:  0    Order Specific Question:   Supervising Provider    Answer:   Merrilee Jansky [[id number]]   albuterol (VENTOLIN HFA) 108 (90 Base) MCG/ACT inhaler    Sig: Inhale 1-2 puffs into the lungs every 6 (six) hours as needed for wheezing or shortness of breath.    Dispense:  8 g    Refill:  0    Order Specific Question:   Supervising Provider    Answer:   Merrilee Jansky [[id number]]   fluconazole (DIFLUCAN) 150 MG tablet  Sig: Take 1 tablet (150 mg total) by mouth every 3 (three) days as needed.    Dispense:  2 tablet    Refill:  0    Order Specific Question:   Supervising Provider    Answer:   Chase Picket [id number]     *If you need refills on other medications prior to your next appointment, please contact your pharmacy*  Follow-Up: Call back or seek an in-person evaluation if the symptoms worsen or if the condition fails to improve as anticipated.  [facility] [phone]  Other Instructions  Acute Bronchitis, Adult  Acute bronchitis is sudden inflammation of the main airways (bronchi) that come off the windpipe (trachea) in the lungs. The swelling causes the airways to get smaller and make more mucus than normal. This can make it hard to breathe and can cause coughing or noisy breathing (wheezing). Acute bronchitis may last several  weeks. The cough may last longer. Allergies, asthma, and exposure to smoke may make the condition worse. What are the causes? This condition can be caused by germs and by substances that irritate the lungs, including: Cold and flu viruses. The most common cause of this condition is the virus that causes the common cold. Bacteria. This is less common. Breathing in substances that irritate the lungs, including: Smoke from cigarettes and other forms of tobacco. Dust and pollen. Fumes from household cleaning products, gases, or burned fuel. Indoor or outdoor air pollution. What increases the risk? The following factors may make you more likely to develop this condition: A weak body's defense system, also called the immune system. A condition that affects your lungs and breathing, such as asthma. What are the signs or symptoms? Common symptoms of this condition include: Coughing. This may bring up clear, yellow, or green mucus from your lungs (sputum). Wheezing. Runny or stuffy nose. Having too much mucus in your lungs (chest congestion). Shortness of breath. Aches and pains, including sore throat or chest. How is this diagnosed? This condition is usually diagnosed based on: Your symptoms and medical history. A physical exam. You may also have other tests, including tests to rule out other conditions, such as pneumonia. These tests include: A test of lung function. Test of a mucus sample to look for the presence of bacteria. Tests to check the oxygen level in your blood. Blood tests. Chest X-ray. How is this treated? Most cases of acute bronchitis clear up over time without treatment. Your health care provider may recommend: Drinking more fluids to help thin your mucus so it is easier to cough up. Taking inhaled medicine (inhaler) to improve air flow in and out of your lungs. Using a vaporizer or a humidifier. These are machines that add water to the air to help you breathe  better. Taking a medicine that thins mucus and clears congestion (expectorant). Taking a medicine that prevents or stops coughing (cough suppressant). It is not common to take an antibiotic medicine for this condition. Follow these instructions at home:  Take over-the-counter and prescription medicines only as told by your health care provider. Use an inhaler, vaporizer, or humidifier as told by your health care provider. Take two teaspoons (10 mL) of honey at bedtime to lessen coughing at night. Drink enough fluid to keep your urine pale yellow. Do not use any products that contain nicotine or tobacco. These products include cigarettes, chewing tobacco, and vaping devices, such as e-cigarettes. If you need help quitting, ask your health care provider. Get plenty  of rest. Return to your normal activities as told by your health care provider. Ask your health care provider what activities are safe for you. Keep all follow-up visits. This is important. How is this prevented? To lower your risk of getting this condition again: Wash your hands often with soap and water for at least 20 seconds. If soap and water are not available, use hand sanitizer. Avoid contact with people who have cold symptoms. Try not to touch your mouth, nose, or eyes with your hands. Avoid breathing in smoke or chemical fumes. Breathing smoke or chemical fumes will make your condition worse. Get the flu shot every year. Contact a health care provider if: Your symptoms do not improve after 2 weeks. You have trouble coughing up the mucus. Your cough keeps you awake at night. You have a fever. Get help right away if you: Cough up blood. Feel pain in your chest. Have severe shortness of breath. Faint or keep feeling like you are going to faint. Have a severe headache. Have a fever or chills that get worse. These symptoms may represent a serious problem that is an emergency. Do not wait to see if the symptoms will go  away. Get medical help right away. Call your local emergency services (911 in the [place] Do not drive yourself to the hospital. Summary Acute bronchitis is inflammation of the main airways (bronchi) that come off the windpipe (trachea) in the lungs. The swelling causes the airways to get smaller and make more mucus than normal. Drinking more fluids can help thin your mucus so it is easier to cough up. Take over-the-counter and prescription medicines only as told by your health care provider. Do not use any products that contain nicotine or tobacco. These products include cigarettes, chewing tobacco, and vaping devices, such as e-cigarettes. If you need help quitting, ask your health care provider. Contact a health care provider if your symptoms do not improve after 2 weeks. This information is not intended to replace advice given to you by your health care provider. Make sure you discuss any questions you have with your health care provider. Document Revised: 09/28/2021 Document Reviewed: 10/19/2020 Elsevier Patient Education  2023 [employer]    If you have been instructed to have an in-person evaluation today at a local Urgent Care facility, please use the link below. It will take you to a list of all of our available [facility], including address, phone number and hours of operation. Please do not delay care.  [facility]  If you or a family member do not have a primary care provider, use the link below to schedule a visit and establish care. When you choose a [facility] primary care physician or advanced practice provider, you gain a long-term partner in health. Find a Primary Care Provider  Learn more about [facility]'s in-office and virtual care options: [facility] - Get Care Now

## 2022-08-16 ENCOUNTER — Telehealth: Payer: Medicaid Other | Admitting: Family Medicine

## 2022-08-16 DIAGNOSIS — R6889 Other general symptoms and signs: Secondary | ICD-10-CM | POA: Diagnosis not present

## 2022-08-16 MED ORDER — OSELTAMIVIR PHOSPHATE 75 MG PO CAPS: 75.0000 mg | ORAL_CAPSULE | Freq: Two times a day (BID) | ORAL | 0 refills | Status: AC

## 2022-08-16 NOTE — Progress Notes (Signed)
Virtual Visit Consent   Lori Raymond, you are scheduled for a virtual visit with a [facility] provider today. Just as with appointments in the office, your consent must be obtained to participate. Your consent will be active for this visit and any virtual visit you may have with one of our providers in the next 365 days. If you have a MyChart account, a copy of this consent can be sent to you electronically.  As this is a virtual visit, video technology does not allow for your provider to perform a traditional examination. This may limit your provider's ability to fully assess your condition. If your provider identifies any concerns that need to be evaluated in person or the need to arrange testing (such as labs, EKG, etc.), we will make arrangements to do so. Although advances in technology are sophisticated, we cannot ensure that it will always work on either your end or our end. If the connection with a video visit is poor, the visit may have to be switched to a telephone visit. With either a video or telephone visit, we are not always able to ensure that we have a secure connection.  By engaging in this virtual visit, you consent to the provision of healthcare and authorize for your insurance to be billed (if applicable) for the services provided during this visit. Depending on your insurance coverage, you may receive a charge related to this service.  I need to obtain your verbal consent now. Are you willing to proceed with your visit today? Lori Raymond has provided verbal consent on 08/16/2022 for a virtual visit (video or telephone). Perlie Mayo, NP  Date: 08/16/2022 8:45 AM  Virtual Visit via Video Note   I, Perlie Mayo, connected with  Lori Raymond  ([id number], 1991/02/19) on 08/16/22 at  8:45 AM EST by a video-enabled telemedicine application and verified that I am speaking with the correct person using two identifiers.  Location: Patient: Virtual Visit Location Patient:  Home Provider: Virtual Visit Location Provider: Home Office   I discussed the limitations of evaluation and management by telemedicine and the availability of in person appointments. The patient expressed understanding and agreed to proceed.    History of Present Illness: Lori Raymond is a 32 y.o. who identifies as a female who was assigned female at birth, and is being seen today for headache, stomach ache and diarrhea, fatigue. Associated symptoms that started now are cough just started today, mild sore throat. Reports like she feels like she was hit by a bus in last 48. Does not have a home covid test. Recent illness with sick child as well.   Problems:  Patient Active Problem List   Diagnosis Date Noted   Herpes genitalia 05/26/2021    Allergies:  Allergies  Allergen Reactions   Lexapro [Escitalopram Oxalate] Anaphylaxis   Estrogens     stroke   Benadryl [Diphenhydramine] Palpitations   Latex Rash   Sulfa Antibiotics Rash   Medications:  Current Outpatient Medications:    albuterol (VENTOLIN HFA) 108 (90 Base) MCG/ACT inhaler, Inhale 1-2 puffs into the lungs every 6 (six) hours as needed for wheezing or shortness of breath., Disp: 8 g, Rfl: 0   benzonatate (TESSALON) 100 MG capsule, Take 1 capsule (100 mg total) by mouth 3 (three) times daily as needed., Disp: 30 capsule, Rfl: 0   brompheniramine-pseudoephedrine-DM 30-2-10 MG/5ML syrup, Take 5 mLs by mouth 4 (four) times daily as needed., Disp: 120 mL, Rfl: 0   fluconazole (DIFLUCAN) 150  MG tablet, Take 1 tablet (150 mg total) by mouth every 3 (three) days as needed., Disp: 2 tablet, Rfl: 0   pantoprazole (PROTONIX) 40 MG tablet, Take 1 tablet (40 mg total) by mouth daily., Disp: 30 tablet, Rfl: 1   predniSONE (DELTASONE) 20 MG tablet, Take 2 tablets (40 mg total) by mouth daily with breakfast., Disp: 10 tablet, Rfl: 0   promethazine (PHENERGAN) 25 MG tablet, Take by mouth., Disp: , Rfl:    valACYclovir (VALTREX) 500 MG  tablet, Take 500 mg by mouth 2 (two) times daily., Disp: , Rfl:   Observations/Objective: Patient is well-developed, well-nourished in no acute distress.  Resting comfortably  at home.  Head is normocephalic, atraumatic.  No labored breathing.  Speech is clear and coherent with logical content.  Patient is alert and oriented at baseline.    Assessment and Plan:   1. Flu-like symptoms  - oseltamivir (TAMIFLU) 75 MG capsule; Take 1 capsule (75 mg total) by mouth 2 (two) times daily for 5 days.  Dispense: 10 capsule; Refill: 0  -Take meds as prescribed -Rest -Use a cool mist humidifier especially during the winter months when heat dries out the air. - Use saline nose sprays frequently to help soothe nasal passages and promote drainage. -Saline irrigations of the nose can be very helpful if done frequently.             * 4X daily for 1 week*             * Use of a nettie pot can be helpful with this.  *Follow directions with this* *Boiled or distilled water only -stay hydrated by drinking plenty of fluids - Keep thermostat turn down low to prevent drying out sinuses - For any cough or congestion- robitussin DM or Delsym as needed - For fever or aches or pains- take tylenol or ibuprofen as directed on bottle             * for fevers greater than 101 orally you may alternate ibuprofen and tylenol every 3 hours.  If you do not improve you will need a follow up visit in person.                Reviewed side effects, risks and benefits of medication.    Patient acknowledged agreement and understanding of the plan.   Past Medical, Surgical, Social History, Allergies, and Medications have been Reviewed.    Follow Up Instructions: I discussed the assessment and treatment plan with the patient. The patient was provided an opportunity to ask questions and all were answered. The patient agreed with the plan and demonstrated an understanding of the instructions.  A copy of instructions  were sent to the patient via MyChart unless otherwise noted below.    The patient was advised to call back or seek an in-person evaluation if the symptoms worsen or if the condition fails to improve as anticipated.  Time:  I spent 10 minutes with the patient via telehealth technology discussing the above problems/concerns.    Perlie Mayo, NP

## 2022-08-16 NOTE — Patient Instructions (Addendum)
Gary Fleet, thank you for joining Perlie Mayo, NP for today's virtual visit.  While this provider is not your primary care provider (PCP), if your PCP is located in our provider database this encounter information will be shared with them immediately following your visit.   [place] account gives you access to today's visit and all your visits, tests, and labs performed at [facility] " click here if you don't have a [facility] account or go to [email or web address]  Consent: (Patient) Lori Raymond provided verbal consent for this virtual visit at the beginning of the encounter.  Current Medications:  Current Outpatient Medications:    oseltamivir (TAMIFLU) 75 MG capsule, Take 1 capsule (75 mg total) by mouth 2 (two) times daily for 5 days., Disp: 10 capsule, Rfl: 0   albuterol (VENTOLIN HFA) 108 (90 Base) MCG/ACT inhaler, Inhale 1-2 puffs into the lungs every 6 (six) hours as needed for wheezing or shortness of breath., Disp: 8 g, Rfl: 0   benzonatate (TESSALON) 100 MG capsule, Take 1 capsule (100 mg total) by mouth 3 (three) times daily as needed., Disp: 30 capsule, Rfl: 0   brompheniramine-pseudoephedrine-DM 30-2-10 MG/5ML syrup, Take 5 mLs by mouth 4 (four) times daily as needed., Disp: 120 mL, Rfl: 0   fluconazole (DIFLUCAN) 150 MG tablet, Take 1 tablet (150 mg total) by mouth every 3 (three) days as needed., Disp: 2 tablet, Rfl: 0   pantoprazole (PROTONIX) 40 MG tablet, Take 1 tablet (40 mg total) by mouth daily., Disp: 30 tablet, Rfl: 1   predniSONE (DELTASONE) 20 MG tablet, Take 2 tablets (40 mg total) by mouth daily with breakfast., Disp: 10 tablet, Rfl: 0   promethazine (PHENERGAN) 25 MG tablet, Take by mouth., Disp: , Rfl:    valACYclovir (VALTREX) 500 MG tablet, Take 500 mg by mouth 2 (two) times daily., Disp: , Rfl:    Medications ordered in this encounter:  Meds ordered this encounter  Medications   oseltamivir (TAMIFLU) 75 MG  capsule    Sig: Take 1 capsule (75 mg total) by mouth 2 (two) times daily for 5 days.    Dispense:  10 capsule    Refill:  0    Order Specific Question:   Supervising Provider    Answer:   Chase Picket [id number]     *If you need refills on other medications prior to your next appointment, please contact your pharmacy*  Follow-Up: Call back or seek an in-person evaluation if the symptoms worsen or if the condition fails to improve as anticipated.  [place] [phone]  Other Instructions  Influenza, Adult Influenza, also called "the flu," is a viral infection that mainly affects the respiratory tract. This includes the lungs, nose, and throat. The flu spreads easily from person to person (is contagious). It causes common cold symptoms, along with high fever and body aches. What are the causes? This condition is caused by the influenza virus. You can get the virus by: Breathing in droplets that are in the air from an infected person's cough or sneeze. Touching something that has the virus on it (has been contaminated) and then touching your mouth, nose, or eyes. What increases the risk? The following factors may make you more likely to get the flu: Not washing or sanitizing your hands often. Having close contact with many people during cold and flu season. Touching your mouth, eyes, or nose without first washing or sanitizing your hands. Not getting an annual  flu shot. You may have a higher risk for the flu, including serious problems, such as a lung infection (pneumonia), if you: Are older than 65. Are pregnant. Have a weakened disease-fighting system (immune system). This includes people who have HIV or AIDS, are on chemotherapy, or are taking medicines that reduce (suppress) the immune system. Have a long-term (chronic) illness, such as heart disease, kidney disease, diabetes, or lung disease. Have a liver disorder. Are severely overweight (morbidly  obese). Have anemia. Have asthma. What are the signs or symptoms? Symptoms of this condition usually begin suddenly and last 4-14 days. These may include: Fever and chills. Headaches, body aches, or muscle aches. Sore throat. Cough. Runny or stuffy (congested) nose. Chest discomfort. Poor appetite. Weakness or fatigue. Dizziness. Nausea or vomiting. How is this diagnosed? This condition may be diagnosed based on: Your symptoms and medical history. A physical exam. Swabbing your nose or throat and testing the fluid for the influenza virus. How is this treated? If the flu is diagnosed early, you can be treated with antiviral medicine that is given by mouth (orally) or through an IV. This can help reduce how severe the illness is and how long it lasts. Taking care of yourself at home can help relieve symptoms. Your health care provider may recommend: Taking over-the-counter medicines. Drinking plenty of fluids. In many cases, the flu goes away on its own. If you have severe symptoms or complications, you may be treated in a hospital. Follow these instructions at home: Activity Rest as needed and get plenty of sleep. Stay home from work or school as told by your health care provider. Unless you are visiting your health care provider, avoid leaving home until your fever has been gone for 24 hours without taking medicine. Eating and drinking Take an oral rehydration solution (ORS). This is a drink that is sold at pharmacies and retail stores. Drink enough fluid to keep your urine pale yellow. Drink clear fluids in small amounts as you are able. Clear fluids include water, ice chips, fruit juice mixed with water, and low-calorie sports drinks. Eat bland, easy-to-digest foods in small amounts as you are able. These foods include bananas, applesauce, rice, lean meats, toast, and crackers. Avoid drinking fluids that contain a lot of sugar or caffeine, such as energy drinks, regular sports  drinks, and soda. Avoid alcohol. Avoid spicy or fatty foods. General instructions     Take over-the-counter and prescription medicines only as told by your health care provider. Use a cool mist humidifier to add humidity to the air in your home. This can make it easier to breathe. When using a cool mist humidifier, clean it daily. Empty the water and replace it with clean water. Cover your mouth and nose when you cough or sneeze. Wash your hands with soap and water often and for at least 20 seconds, especially after you cough or sneeze. If soap and water are not available, use alcohol-based hand sanitizer. Keep all follow-up visits. This is important. How is this prevented?  Get an annual flu shot. This is usually available in late summer, fall, or winter. Ask your health care provider when you should get your flu shot. Avoid contact with people who are sick during cold and flu season. This is generally fall and winter. Contact a health care provider if: You develop new symptoms. You have: Chest pain. Diarrhea. A fever. Your cough gets worse. You produce more mucus. You feel nauseous or you vomit. Get help right away if  you: Develop shortness of breath or have difficulty breathing. Have skin or nails that turn a bluish color. Have severe pain or stiffness in your neck. Develop a sudden headache or sudden pain in your face or ear. Cannot eat or drink without vomiting. These symptoms may represent a serious problem that is an emergency. Do not wait to see if the symptoms will go away. Get medical help right away. Call your local emergency services (911 in the [place] Do not drive yourself to the hospital. Summary Influenza, also called "the flu," is a viral infection that primarily affects your respiratory tract. Symptoms of the flu usually begin suddenly and last 4-14 days. Getting an annual flu shot is the best way to prevent getting the flu. Stay home from work or school as told  by your health care provider. Unless you are visiting your health care provider, avoid leaving home until your fever has been gone for 24 hours without taking medicine. Keep all follow-up visits. This is important. This information is not intended to replace advice given to you by your health care provider. Make sure you discuss any questions you have with your health care provider. Document Revised: 02/05/2020 Document Reviewed: 02/05/2020 Elsevier Patient Education  [place].    If you have been instructed to have an in-person evaluation today at a local Urgent Care facility, please use the link below. It will take you to a list of all of our available [facility], including address, phone number and hours of operation. Please do not delay care.  [facility]  If you or a family member do not have a primary care provider, use the link below to schedule a visit and establish care. When you choose a [facility] primary care physician or advanced practice provider, you gain a long-term partner in health. Find a Primary Care Provider  Learn more about [facility]'s in-office and virtual care options: [facility]

## 2022-09-04 ENCOUNTER — Ambulatory Visit: Payer: Medicaid Other | Admitting: Infectious Diseases

## 2022-09-27 ENCOUNTER — Emergency Department (HOSPITAL_BASED_OUTPATIENT_CLINIC_OR_DEPARTMENT_OTHER)
Admission: EM | Admit: 2022-09-27 | Discharge: 2022-09-27 | Disposition: A | Discharge status: Home / Self Care | Payer: Medicaid Other | Attending: Emergency Medicine | Admitting: Emergency Medicine

## 2022-09-27 ENCOUNTER — Other Ambulatory Visit: Payer: Self-pay

## 2022-09-27 ENCOUNTER — Emergency Department (HOSPITAL_BASED_OUTPATIENT_CLINIC_OR_DEPARTMENT_OTHER): Payer: Medicaid Other

## 2022-09-27 ENCOUNTER — Encounter (HOSPITAL_BASED_OUTPATIENT_CLINIC_OR_DEPARTMENT_OTHER): Payer: Self-pay | Admitting: Emergency Medicine

## 2022-09-27 DIAGNOSIS — Z91040 Latex allergy status: Secondary | ICD-10-CM | POA: Diagnosis not present

## 2022-09-27 DIAGNOSIS — R1033 Periumbilical pain: Secondary | ICD-10-CM | POA: Insufficient documentation

## 2022-09-27 DIAGNOSIS — R109 Unspecified abdominal pain: Secondary | ICD-10-CM | POA: Diagnosis present

## 2022-09-27 LAB — CBC
HCT: 39.5 % (ref 36.0–46.0)
Hemoglobin: 13 g/dL (ref 12.0–15.0)
MCH: 26.5 pg (ref 26.0–34.0)
MCHC: 32.9 g/dL (ref 30.0–36.0)
MCV: 80.6 fL (ref 80.0–100.0)
Platelets: 291 10*3/uL (ref 150–400)
RBC: 4.9 MIL/uL (ref 3.87–5.11)
RDW: 14.2 % (ref 11.5–15.5)
WBC: 7.9 10*3/uL (ref 4.0–10.5)
nRBC: 0 % (ref 0.0–0.2)

## 2022-09-27 LAB — COMPREHENSIVE METABOLIC PANEL
ALT: 14 U/L (ref 0–44)
AST: 15 U/L (ref 15–41)
Albumin: 4.1 g/dL (ref 3.5–5.0)
Alkaline Phosphatase: 57 U/L (ref 38–126)
Anion gap: 9 (ref 5–15)
BUN: 16 mg/dL (ref 6–20)
CO2: 26 mmol/L (ref 22–32)
Calcium: 9.4 mg/dL (ref 8.9–10.3)
Chloride: 104 mmol/L (ref 98–111)
Creatinine, Ser: 0.86 mg/dL (ref 0.44–1.00)
GFR, Estimated: >60 mL/min (ref >60)
Glucose, Bld: 99 mg/dL (ref 70–99)
Potassium: 3.9 mmol/L (ref 3.5–5.1)
Sodium: 139 mmol/L (ref 135–145)
Total Bilirubin: 0.2 mg/dL — ABNORMAL LOW (ref 0.3–1.2)
Total Protein: 7.3 g/dL (ref 6.5–8.1)

## 2022-09-27 LAB — URINALYSIS, ROUTINE W REFLEX MICROSCOPIC
Bilirubin Urine: NEGATIVE
Glucose, UA: NEGATIVE mg/dL
Hgb urine dipstick: NEGATIVE
Ketones, ur: NEGATIVE mg/dL
Leukocytes,Ua: NEGATIVE
Nitrite: NEGATIVE
Specific Gravity, Urine: >1.046 — ABNORMAL HIGH (ref 1.005–1.030)
pH: 6.5 (ref 5.0–8.0)

## 2022-09-27 LAB — LIPASE, BLOOD: Lipase: 37 U/L (ref 11–51)

## 2022-09-27 LAB — HCG, SERUM, QUALITATIVE: Preg, Serum: NEGATIVE

## 2022-09-27 MED ORDER — IOHEXOL 300 MG/ML  SOLN
100.0000 mL | Freq: Once | INTRAMUSCULAR | Status: AC | PRN
  Administered 2022-09-27: 100 mL via INTRAVENOUS

## 2022-09-27 NOTE — ED Notes (Signed)
Out to CT 

## 2022-09-27 NOTE — ED Provider Notes (Addendum)
[facility] Provider Note   CSN: [id number] Arrival date & time: 09/27/22  1659     History  Chief Complaint  Patient presents with   Abdominal Pain    Lori Raymond is a 32 y.o. female history of spinal neck presented with 2 weeks of discomfort above Lori Raymond navel.  Patient states Lori Raymond feels something moving around Lori Raymond abdomen causing a sharp pain that does not radiate.  Patient states Lori Raymond still has Lori Raymond gallbladder and appendix.  Patient is tried ibuprofen and Tylenol to no relief.  Patient denied any constipation/diarrhea, fever/chills, nausea/vomiting, history of hernia.  Patient states Lori Raymond has a history of IBS with states this feels different, like "worms inside of me."  Patient states Lori Raymond is still passing gas and having bowel movements today.  Denies chest pain, shortness of breath, fever/chills, change in sensation/motor skills, recent trauma, vaginal bleeding, dysuria  Home Medications Prior to Admission medications   Medication Sig Start Date End Date Taking? Authorizing Provider  albuterol (VENTOLIN HFA) 108 (90 Base) MCG/ACT inhaler Inhale 1-2 puffs into the lungs every 6 (six) hours as needed for wheezing or shortness of breath. 04/12/22   Mar Daring, PA-C  benzonatate (TESSALON) 100 MG capsule Take 1 capsule (100 mg total) by mouth 3 (three) times daily as needed. 04/12/22   Mar Daring, PA-C  brompheniramine-pseudoephedrine-DM 30-2-10 MG/5ML syrup Take 5 mLs by mouth 4 (four) times daily as needed. 04/12/22   Mar Daring, PA-C  fluconazole (DIFLUCAN) 150 MG tablet Take 1 tablet (150 mg total) by mouth every 3 (three) days as needed. 04/12/22   Mar Daring, PA-C  pantoprazole (PROTONIX) 40 MG tablet Take 1 tablet (40 mg total) by mouth daily. 08/25/21   Hayden Rasmussen, MD  predniSONE (DELTASONE) 20 MG tablet Take 2 tablets (40 mg total) by mouth daily with breakfast. 04/12/22   Mar Daring,  PA-C  promethazine (PHENERGAN) 25 MG tablet Take by mouth. 09/15/21   [provider]  valACYclovir (VALTREX) 500 MG tablet Take 500 mg by mouth 2 (two) times daily.    [provider]      Allergies    Lexapro [escitalopram oxalate], Estrogens, Benadryl [diphenhydramine], Latex, and Sulfa antibiotics    Review of Systems   Review of Systems  Gastrointestinal:  Positive for abdominal pain.  See HPI  Physical Exam Updated Vital Signs BP 120/62   Pulse 70   Temp 97.7 F (36.5 C) (Temporal)   Resp 20   Ht 5\' 2"  (1.575 m)   Wt 108.9 kg   SpO2 99%   BMI 43.90 kg/m  Physical Exam Vitals reviewed.  Constitutional:      General: Lori Raymond is not in acute distress. HENT:     Head: Normocephalic and atraumatic.  Eyes:     Extraocular Movements: Extraocular movements intact.     Conjunctiva/sclera: Conjunctivae normal.     Pupils: Pupils are equal, round, and reactive to light.  Cardiovascular:     Rate and Rhythm: Normal rate and regular rhythm.     Pulses: Normal pulses.     Heart sounds: Normal heart sounds.     Comments: 2+ bilateral radial/dorsalis pedis pulses with regular rate Pulmonary:     Effort: Pulmonary effort is normal. No respiratory distress.     Breath sounds: Normal breath sounds.  Abdominal:     Palpations: Abdomen is soft.     Tenderness: There is no abdominal tenderness. There is no  right CVA tenderness, left CVA tenderness, guarding or rebound.  Musculoskeletal:        General: Normal range of motion.     Cervical back: Normal range of motion and neck supple.     Comments: 5 out of 5 bilateral grip/leg extension strength  Skin:    General: Skin is warm and dry.     Capillary Refill: Capillary refill takes less than 2 seconds.  Neurological:     General: No focal deficit present.     Mental Status: Lori Raymond is alert and oriented to person, place, and time.     Comments: Sensation intact in all 4 limbs  Psychiatric:        Mood and Affect: Mood  normal.     ED Results / Procedures / Treatments   Labs (all labs ordered are listed, but only abnormal results are displayed) Labs Reviewed  COMPREHENSIVE METABOLIC PANEL - Abnormal; Notable for the following components:      Result Value   Total Bilirubin 0.2 (*)    All other components within normal limits  URINALYSIS, ROUTINE W REFLEX MICROSCOPIC - Abnormal; Notable for the following components:   APPearance CLOUDY (*)    Protein, ur 30 (*)    Leukocytes,Ua TRACE (*)    Bacteria, UA RARE (*)    Non Squamous Epithelial 0-5 (*)    All other components within normal limits  URINALYSIS, ROUTINE W REFLEX MICROSCOPIC - Abnormal; Notable for the following components:   Specific Gravity, Urine >1.046 (*)    Protein, ur TRACE (*)    All other components within normal limits  LIPASE, BLOOD  CBC  HCG, SERUM, QUALITATIVE    EKG None  Radiology CT Abdomen Pelvis W Contrast  Result Date: 09/27/2022 CLINICAL DATA:  Pain above the navel EXAM: CT ABDOMEN AND PELVIS WITH CONTRAST TECHNIQUE: Multidetector CT imaging of the abdomen and pelvis was performed using the standard protocol following bolus administration of intravenous contrast. RADIATION DOSE REDUCTION: This exam was performed according to the departmental dose-optimization program which includes automated exposure control, adjustment of the mA and/or kV according to patient size and/or use of iterative reconstruction technique. CONTRAST:  144mL OMNIPAQUE IOHEXOL 300 MG/ML  SOLN COMPARISON:  None Available. FINDINGS: Lower chest: No acute abnormality. Hepatobiliary: No focal liver abnormality is seen. No gallstones, gallbladder wall thickening, or biliary dilatation. Pancreas: Unremarkable. No pancreatic ductal dilatation or surrounding inflammatory changes. Spleen: Borderline enlarged at 13 cm. Adrenals/Urinary Tract: Adrenal glands are unremarkable. Kidneys are normal, without renal calculi, focal lesion, or hydronephrosis. Bladder is  unremarkable. Stomach/Bowel: Stomach is within normal limits. Appendix appears normal. No evidence of bowel wall thickening, distention, or inflammatory changes. Vascular/Lymphatic: No significant vascular findings are present. No enlarged abdominal or pelvic lymph nodes. Reproductive: Uterus and bilateral adnexa are unremarkable. Other: No abdominal wall hernia or abnormality. No abdominopelvic ascites. Musculoskeletal: No acute or significant osseous findings. IMPRESSION: 1. No CT evidence for acute intra-abdominal or pelvic abnormality. 2. Borderline enlarged spleen as before Electronically Signed   By: Donavan Foil M.D.   On: 09/27/2022 20:28    Procedures Procedures    Medications Ordered in ED Medications  iohexol (OMNIPAQUE) 300 MG/ML solution 100 mL (100 mLs Intravenous Contrast Given 09/27/22 2009)    ED Course/ Medical Decision Making/ A&P                             Medical Decision Making Amount and/or Complexity of  Data Reviewed Labs: ordered. Radiology: ordered.  Risk Prescription drug management.   Nelani Lori Raymond 32 y.o. presented today for abdominal pain. Working DDx that I considered at this time includes, but not limited to, IBS, gastroenteritis, colitis, small bowel obstruction, appendicitis, cholecystitis, pancreatitis, nephrolithiasis, UTI, pyleonephritis, ruptured ectopic pregnancy, PID, ovarian/ torsion.  R/o DDx: gastroenteritis, colitis, small bowel obstruction, appendicitis, cholecystitis, pancreatitis, nephrolithiasis, UTI, pyleonephritis, ruptured ectopic pregnancy, PID, ovarian torsion: These are considered less likely due to history of present illness and physical exam findings.  Review of prior external notes: 08/17/2022 office visit  Unique Tests and My Interpretation:  CBC with differential: Unremarkable CMP: Unremarkable Lipase: Unremarkable UA: Unremarkable Serum qualitative hCG: Negative CT Abd/Pelvis with contrast: Borderline enlarged spleen  seen on previous CT scans; no new intra-abdominal changes  Discussion with Independent Historian: None  Discussion of Management of Tests: None  Risk: Low:  - based on diagnostic testing/clinical impression and treatment plan  Risk Stratification Score: None  Plan: Patient presented for abdominal pain. On exam patient was no acute distress and stable vitals.  Patient unremarkable physical exam however was extremely anxious and wanted a CT scan.  On chart review patient has had multiple CT scans in the past and we spoke about the possibility this is most likely an IBS exacerbation and that the CT scan may not show anything new and carries the risk of additional radiation.  Patient states Lori Raymond still wanted the CT scan and so a CT scan was ordered.  Labs were ordered as well.  Patient stable at this time.  Upon entering room initially patient also stated that after reviewing the MyChart app for entered the room Lori Raymond had a urine result however did not provide a urine sample so Lori Raymond was confused as to why there is a urine sample in Lori Raymond chart.  I told Lori Raymond I would ask the nurse to asked the lab if there was a mixup and asked if Lori Raymond would be able to provide another urine sample.  Patient was able to provide another urine sample and that sample is in the chart.  Labs and CT came back negative for any acute changes.  At this time patient stable for discharge with GI follow-up with Lori Raymond GI specialist.  I spoke with Lori Raymond about monitoring Lori Raymond symptoms and diet as a suspect this is most likely exacerbation of Lori Raymond IBS or new symptoms of Lori Raymond IBS.       Final Clinical Impression(s) / ED Diagnoses Final diagnoses:  Periumbilical abdominal pain    Rx / DC Orders ED Discharge Orders     None         Elvina Sidle 09/27/22 2133    Gareth Morgan, MD 09/28/22 1245

## 2022-09-27 NOTE — ED Triage Notes (Signed)
Pt arrives to ED with c/o abdominal pain located above naval x2 weeks. LMP x3 weeks ago, hx irregular periods.

## 2022-09-27 NOTE — Discharge Instructions (Signed)
Please follow-up with your GI specialist in the next few days to be reevaluated regarding recent symptoms and ER visit.  Please monitor and document your meals and track a few afterwards to determine if there is correlation between what you are eating and your symptoms.  If symptoms worsen please return to ER.

## 2022-09-28 LAB — URINALYSIS, ROUTINE W REFLEX MICROSCOPIC
Bilirubin Urine: NEGATIVE
Glucose, UA: NEGATIVE mg/dL
Hgb urine dipstick: NEGATIVE
Ketones, ur: NEGATIVE mg/dL
Nitrite: NEGATIVE
Protein, ur: 30 mg/dL — AB
Specific Gravity, Urine: 1.019 (ref 1.005–1.030)
Squamous Epithelial / HPF: >50 /HPF (ref 0–5)
pH: 5 (ref 5.0–8.0)

## 2022-10-29 ENCOUNTER — Ambulatory Visit: Payer: Medicaid Other | Admitting: Infectious Disease

## 2022-12-17 ENCOUNTER — Encounter: Payer: Self-pay | Admitting: Infectious Disease

## 2022-12-17 DIAGNOSIS — N766 Ulceration of vulva: Secondary | ICD-10-CM

## 2022-12-17 DIAGNOSIS — K121 Other forms of stomatitis: Secondary | ICD-10-CM | POA: Insufficient documentation

## 2022-12-17 HISTORY — DX: Ulceration of vulva: N76.6

## 2022-12-17 HISTORY — DX: Other forms of stomatitis: K12.1

## 2022-12-17 NOTE — Progress Notes (Deleted)
   Reason for Infectious Disease Consult: oral and vulvar ulcers  Requesting Physician:    Subjective:    Patient ID: [name], female    DOB: 1990-08-09, 32 y.o.   MRN: [id number]  HPI  Past Medical History:  Diagnosis Date   Cardiomyopathy (HCC)    CHF (congestive heart failure) (HCC)    Depression    Endometriosis    Herpes    IBS (irritable bowel syndrome)    Polycystic ovarian disease    PTSD (post-traumatic stress disorder)    Stroke (HCC)    Vitamin D deficiency     Past Surgical History:  Procedure Laterality Date   TONSILLECTOMY      No family history on file.    Social History   Socioeconomic History   Marital status: Single    Spouse name: Not on file   Number of children: Not on file   Years of education: Not on file   Highest education level: Not on file  Occupational History   Not on file  Tobacco Use   Smoking status: Never   Smokeless tobacco: Never  Vaping Use   Vaping Use: Never used  Substance and Sexual Activity   Alcohol use: Not Currently   Drug use: Yes    Types: Marijuana   Sexual activity: Not on file  Other Topics Concern   Not on file  Social History Narrative   Not on file   Social Determinants of Health   Financial Resource Strain: Not on file  Food Insecurity: Not on file  Transportation Needs: Not on file  Physical Activity: Not on file  Stress: Not on file  Social Connections: Not on file    Allergies  Allergen Reactions   Lexapro [Escitalopram Oxalate] Anaphylaxis   Estrogens     stroke   Benadryl [Diphenhydramine] Palpitations   Latex Rash   Sulfa Antibiotics Rash     Current Outpatient Medications:    albuterol (VENTOLIN HFA) 108 (90 Base) MCG/ACT inhaler, Inhale 1-2 puffs into the lungs every 6 (six) hours as needed for wheezing or shortness of breath., Disp: 8 g, Rfl: 0   benzonatate (TESSALON) 100 MG capsule, Take 1 capsule (100 mg total) by mouth 3 (three) times daily as needed., Disp: 30  capsule, Rfl: 0   brompheniramine-pseudoephedrine-DM 30-2-10 MG/5ML syrup, Take 5 mLs by mouth 4 (four) times daily as needed., Disp: 120 mL, Rfl: 0   fluconazole (DIFLUCAN) 150 MG tablet, Take 1 tablet (150 mg total) by mouth every 3 (three) days as needed., Disp: 2 tablet, Rfl: 0   pantoprazole (PROTONIX) 40 MG tablet, Take 1 tablet (40 mg total) by mouth daily., Disp: 30 tablet, Rfl: 1   predniSONE (DELTASONE) 20 MG tablet, Take 2 tablets (40 mg total) by mouth daily with breakfast., Disp: 10 tablet, Rfl: 0   promethazine (PHENERGAN) 25 MG tablet, Take by mouth., Disp: , Rfl:    valACYclovir (VALTREX) 500 MG tablet, Take 500 mg by mouth 2 (two) times daily., Disp: , Rfl:    Review of Systems     Objective:   Physical Exam        Assessment & Plan:

## 2022-12-18 ENCOUNTER — Telehealth: Payer: Self-pay

## 2022-12-18 ENCOUNTER — Ambulatory Visit: Payer: Medicaid Other | Admitting: Infectious Disease

## 2022-12-18 DIAGNOSIS — A6000 Herpesviral infection of urogenital system, unspecified: Secondary | ICD-10-CM

## 2022-12-18 DIAGNOSIS — N766 Ulceration of vulva: Secondary | ICD-10-CM

## 2022-12-18 DIAGNOSIS — K121 Other forms of stomatitis: Secondary | ICD-10-CM

## 2022-12-18 NOTE — Telephone Encounter (Signed)
Left voicemail requesting patient call office back to confirm appointment or reschedule is she is not able to come in today.  Juanita Laster, RMA

## 2023-05-20 ENCOUNTER — Telehealth: Payer: Medicaid Other

## 2023-05-20 ENCOUNTER — Telehealth: Payer: Medicaid Other | Admitting: Physician Assistant

## 2023-05-20 DIAGNOSIS — Z760 Encounter for issue of repeat prescription: Secondary | ICD-10-CM

## 2023-05-20 DIAGNOSIS — R110 Nausea: Secondary | ICD-10-CM

## 2023-05-20 DIAGNOSIS — R197 Diarrhea, unspecified: Secondary | ICD-10-CM | POA: Diagnosis not present

## 2023-05-20 MED ORDER — ONDANSETRON HCL 4 MG PO TABS: 4.0000 mg | ORAL_TABLET | Freq: Three times a day (TID) | ORAL | 0 refills | Status: DC | PRN

## 2023-05-20 MED ORDER — VALACYCLOVIR HCL 500 MG PO TABS: 500.0000 mg | ORAL_TABLET | Freq: Two times a day (BID) | ORAL | 0 refills | Status: AC

## 2023-05-20 NOTE — Progress Notes (Signed)
Virtual Visit Consent   Lori Raymond, you are scheduled for a virtual visit with a [place] provider today. Just as with appointments in the office, your consent must be obtained to participate. Your consent will be active for this visit and any virtual visit you may have with one of our providers in the next 365 days. If you have a MyChart account, a copy of this consent can be sent to you electronically.  As this is a virtual visit, video technology does not allow for your provider to perform a traditional examination. This may limit your provider's ability to fully assess your condition. If your provider identifies any concerns that need to be evaluated in person or the need to arrange testing (such as labs, EKG, etc.), we will make arrangements to do so. Although advances in technology are sophisticated, we cannot ensure that it will always work on either your end or our end. If the connection with a video visit is poor, the visit may have to be switched to a telephone visit. With either a video or telephone visit, we are not always able to ensure that we have a secure connection.  By engaging in this virtual visit, you consent to the provision of healthcare and authorize for your insurance to be billed (if applicable) for the services provided during this visit. Depending on your insurance coverage, you may receive a charge related to this service.  I need to obtain your verbal consent now. Are you willing to proceed with your visit today? Shamieka Przywara has provided verbal consent on 05/20/2023 for a virtual visit (video or telephone). Tylene Fantasia Ward, PA-C  Date: 05/20/2023 7:52 PM  Virtual Visit via Video Note   I, Tylene Fantasia Ward, connected with  Kamrin Allendorf  ([id number], 1991/05/26) on 05/20/23 at  7:45 PM EST by a video-enabled telemedicine application and verified that I am speaking with the correct person using two identifiers.  Location: Patient: Virtual Visit Location Patient:  Home Provider: Virtual Visit Location Provider: Home Office   I discussed the limitations of evaluation and management by telemedicine and the availability of in person appointments. The patient expressed understanding and agreed to proceed.    History of Present Illness: Lori Raymond is a 32 y.o. who identifies as a female who was assigned female at birth, and is being seen today for nausea, diarrhea that started today.  Reports it may be associated with a wrap she had at [employer]. Denies fever, but states she does feel flushed.  She denies vomiting.  She has taken zofran with some relief, needs a refill.  Pt requests refill of valtrex as well, worried she may have an outbreak with illness.  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   PTSD (post-traumatic stress disorder) 12/04/2016   VT (ventricular tachycardia) (HCC) 11/14/2016   Headache, unspecified  12/30/2014   Left arm numbness 12/30/2014   Right sided weakness 12/30/2014   Body mass index (BMI) 35.0-35.9, adult 06/11/2013   History of postpartum depression 02/26/2013   PAC (premature atrial contraction) 08/11/2012   History of PCOS 05/13/2012   Weight loss 05/13/2012    Allergies:  Allergies  Allergen Reactions   Lexapro [Escitalopram Oxalate] Anaphylaxis   Sulfa Antibiotics Rash and Hives   Estrogens     stroke   Benadryl [Diphenhydramine] Palpitations   Latex Rash   Medications:  Current Outpatient Medications:    ondansetron (ZOFRAN) 4 MG tablet, Take 1 tablet (4 mg total) by mouth every 8 (eight) hours as needed for nausea or vomiting., Disp: 20 tablet, Rfl: 0   valACYclovir (VALTREX) 500 MG tablet, Take 1 tablet (500 mg total) by mouth 2 (two) times daily., Disp: 60 tablet, Rfl: 0   albuterol (VENTOLIN HFA) 108 (90 Base) MCG/ACT inhaler, Inhale 1-2 puffs into the lungs every 6 (six) hours as needed for wheezing or shortness of breath., Disp: 8 g, Rfl: 0   benzonatate (TESSALON) 100 MG capsule, Take 1 capsule (100 mg total) by mouth 3 (three) times daily as needed., Disp: 30 capsule, Rfl: 0   brompheniramine-pseudoephedrine-DM 30-2-10 MG/5ML syrup, Take 5 mLs by mouth 4 (four) times daily as needed., Disp: 120 mL, Rfl: 0   fluconazole (DIFLUCAN) 150 MG tablet, Take 1 tablet (150 mg total) by mouth every 3 (three) days as needed., Disp: 2 tablet, Rfl: 0   pantoprazole (PROTONIX) 40 MG tablet, Take 1 tablet (40 mg total) by mouth daily., Disp: 30 tablet, Rfl: 1   predniSONE (DELTASONE) 20 MG tablet, Take 2 tablets (40 mg total) by mouth daily with breakfast., Disp: 10 tablet, Rfl: 0   promethazine (PHENERGAN) 25 MG tablet, Take by mouth., Disp: , Rfl:   Observations/Objective: Patient is well-developed, well-nourished in no acute distress.  Resting comfortably at home.  Head is normocephalic, atraumatic.  No labored breathing.  Speech is clear and coherent with logical  content.  Patient is alert and oriented at baseline.    Assessment and Plan: 1. Nausea  2. Diarrhea, unspecified type  3. Medication refill  Zofran prescribed, supportive care discussed.  Work note given.  Pt is able to tolerate fluids.   Valtrex refilled.   Follow Up Instructions: I discussed the assessment and treatment plan with the patient. The patient was provided an opportunity to ask questions and all were answered. The patient agreed with the plan and demonstrated an understanding of the instructions.  A copy of instructions were sent to the patient via MyChart unless otherwise noted below.     The patient was advised to call back or seek an in-person evaluation if the symptoms worsen or if the condition fails to improve as anticipated.    Tylene Fantasia Ward, PA-C

## 2023-07-07 IMAGING — CR DG CHEST 2V
2 series · 2 of 2 positions shown · non-contrast
Comparison: 03/22/2021

CLINICAL DATA: Chest, neck, and left arm pain.

EXAM:
CHEST - 2 VIEW

[chest pa]
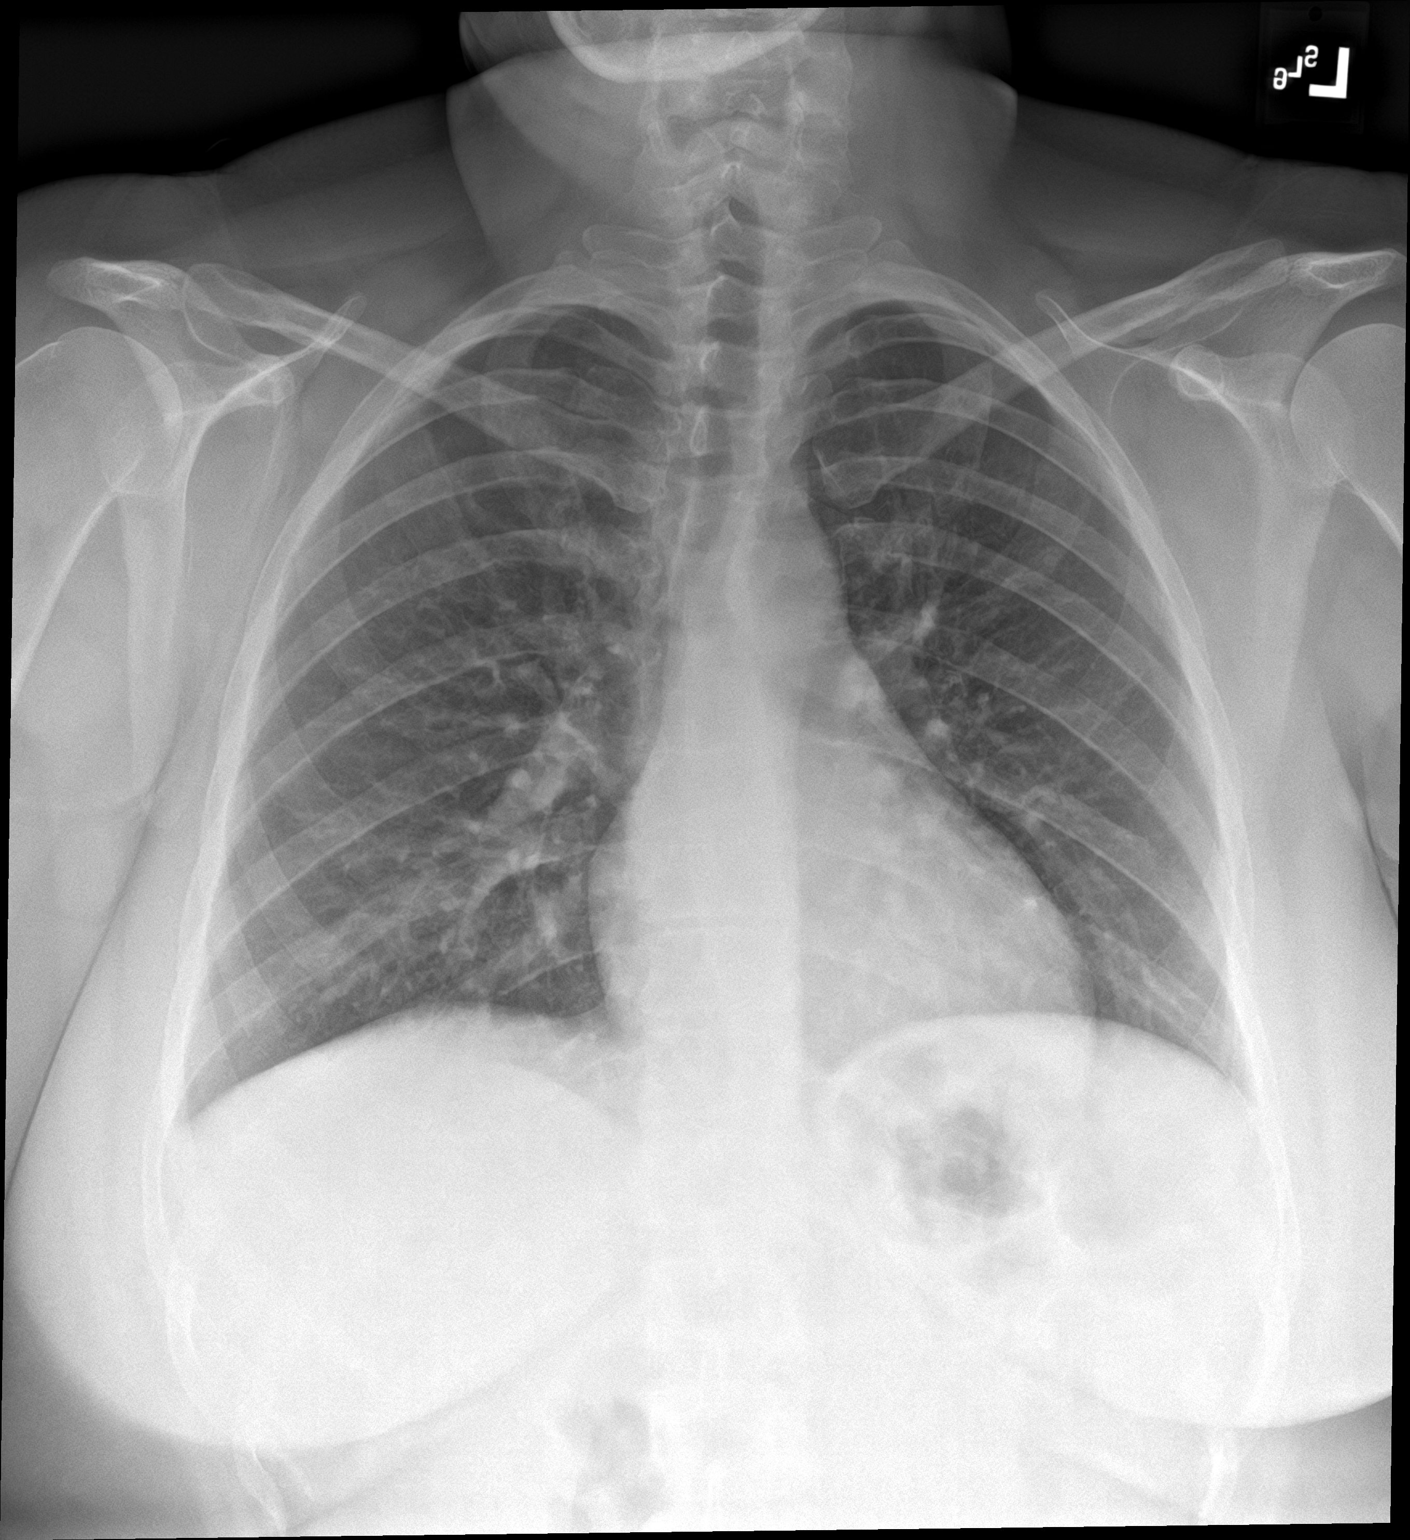

[chest lat]
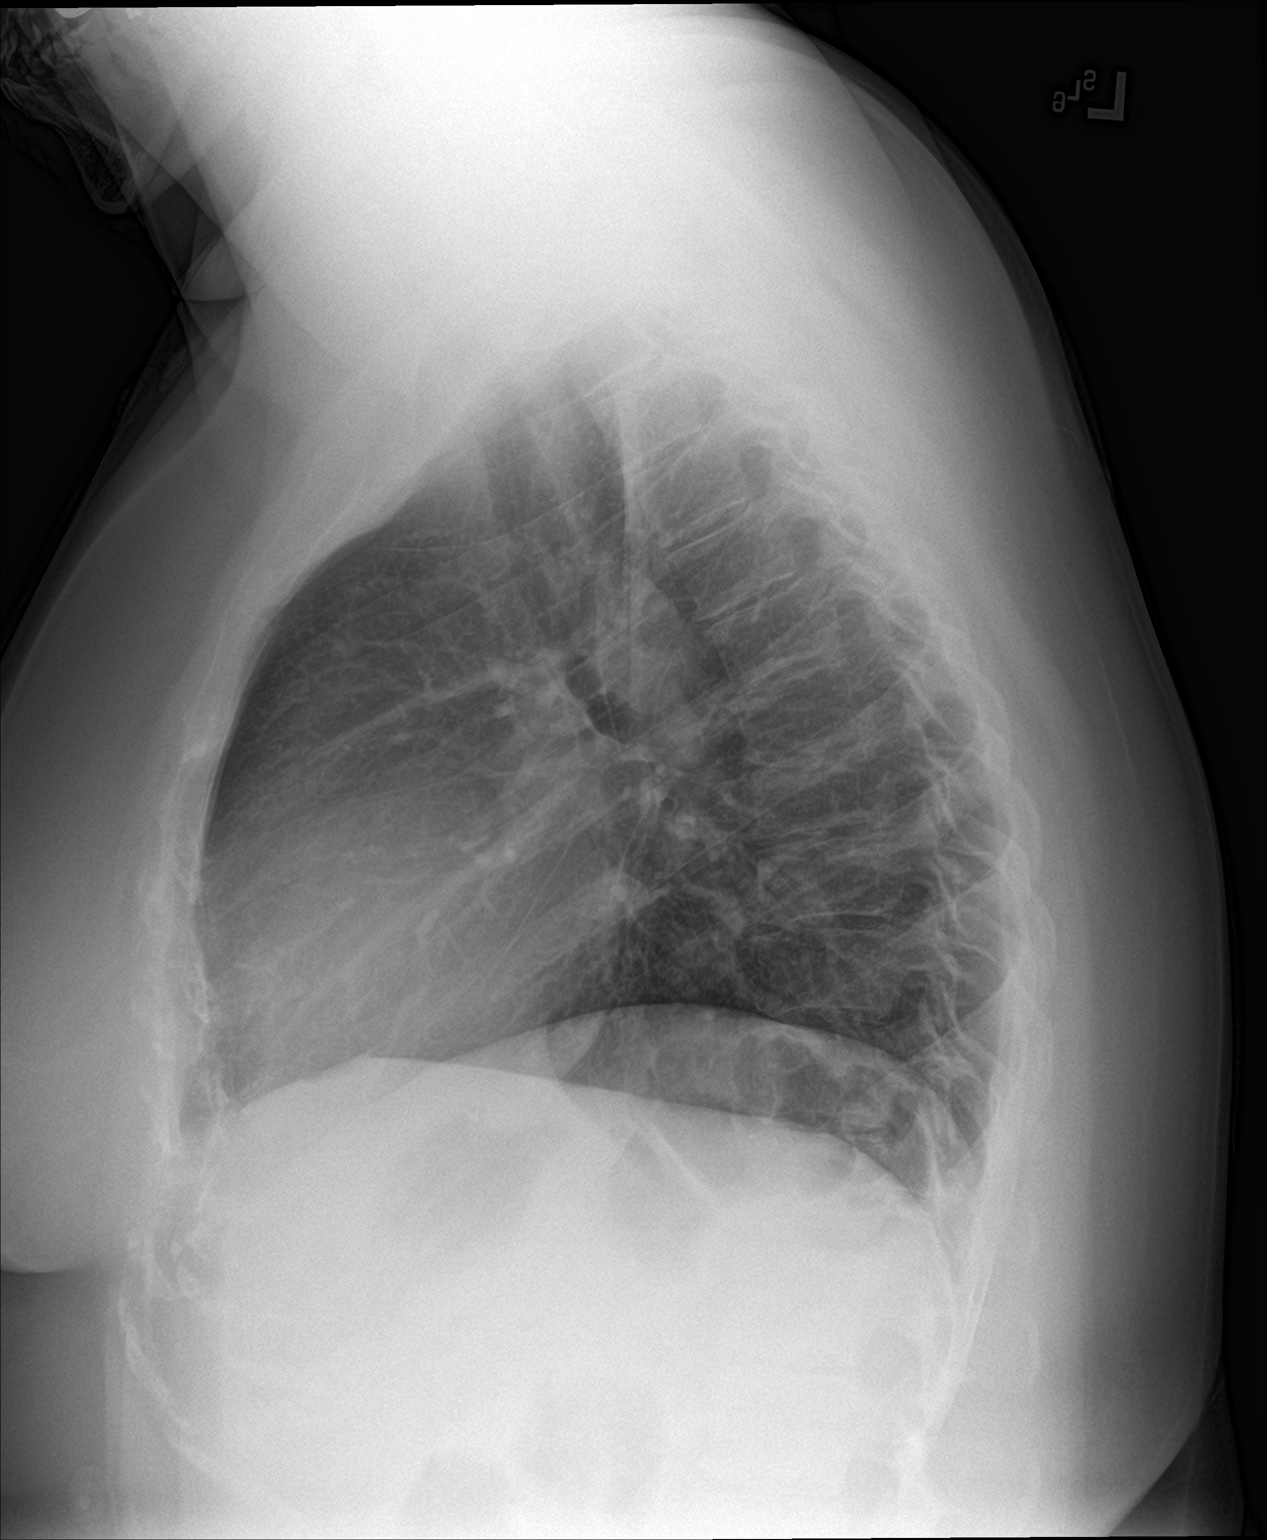

[2 of 2 positions shown; findings below may reference images not displayed]

FINDINGS: The cardiomediastinal silhouette is unchanged with normal heart
size. The lungs are well inflated and clear. No pleural effusion or
pneumothorax is identified. No acute osseous abnormality is seen.
IMPRESSION: No active cardiopulmonary disease.

## 2023-08-26 ENCOUNTER — Institutional Professional Consult (permissible substitution): Payer: Medicaid Other | Admitting: Plastic Surgery

## 2023-08-28 ENCOUNTER — Telehealth: Payer: Self-pay

## 2023-09-03 IMAGING — DX DG CHEST 1V PORT
1 series · 1 of 1 positions shown · non-contrast
Comparison: 06/28/2021

CLINICAL DATA: Upper abdominal pain

EXAM:
PORTABLE CHEST 1 VIEW

[chest ap]
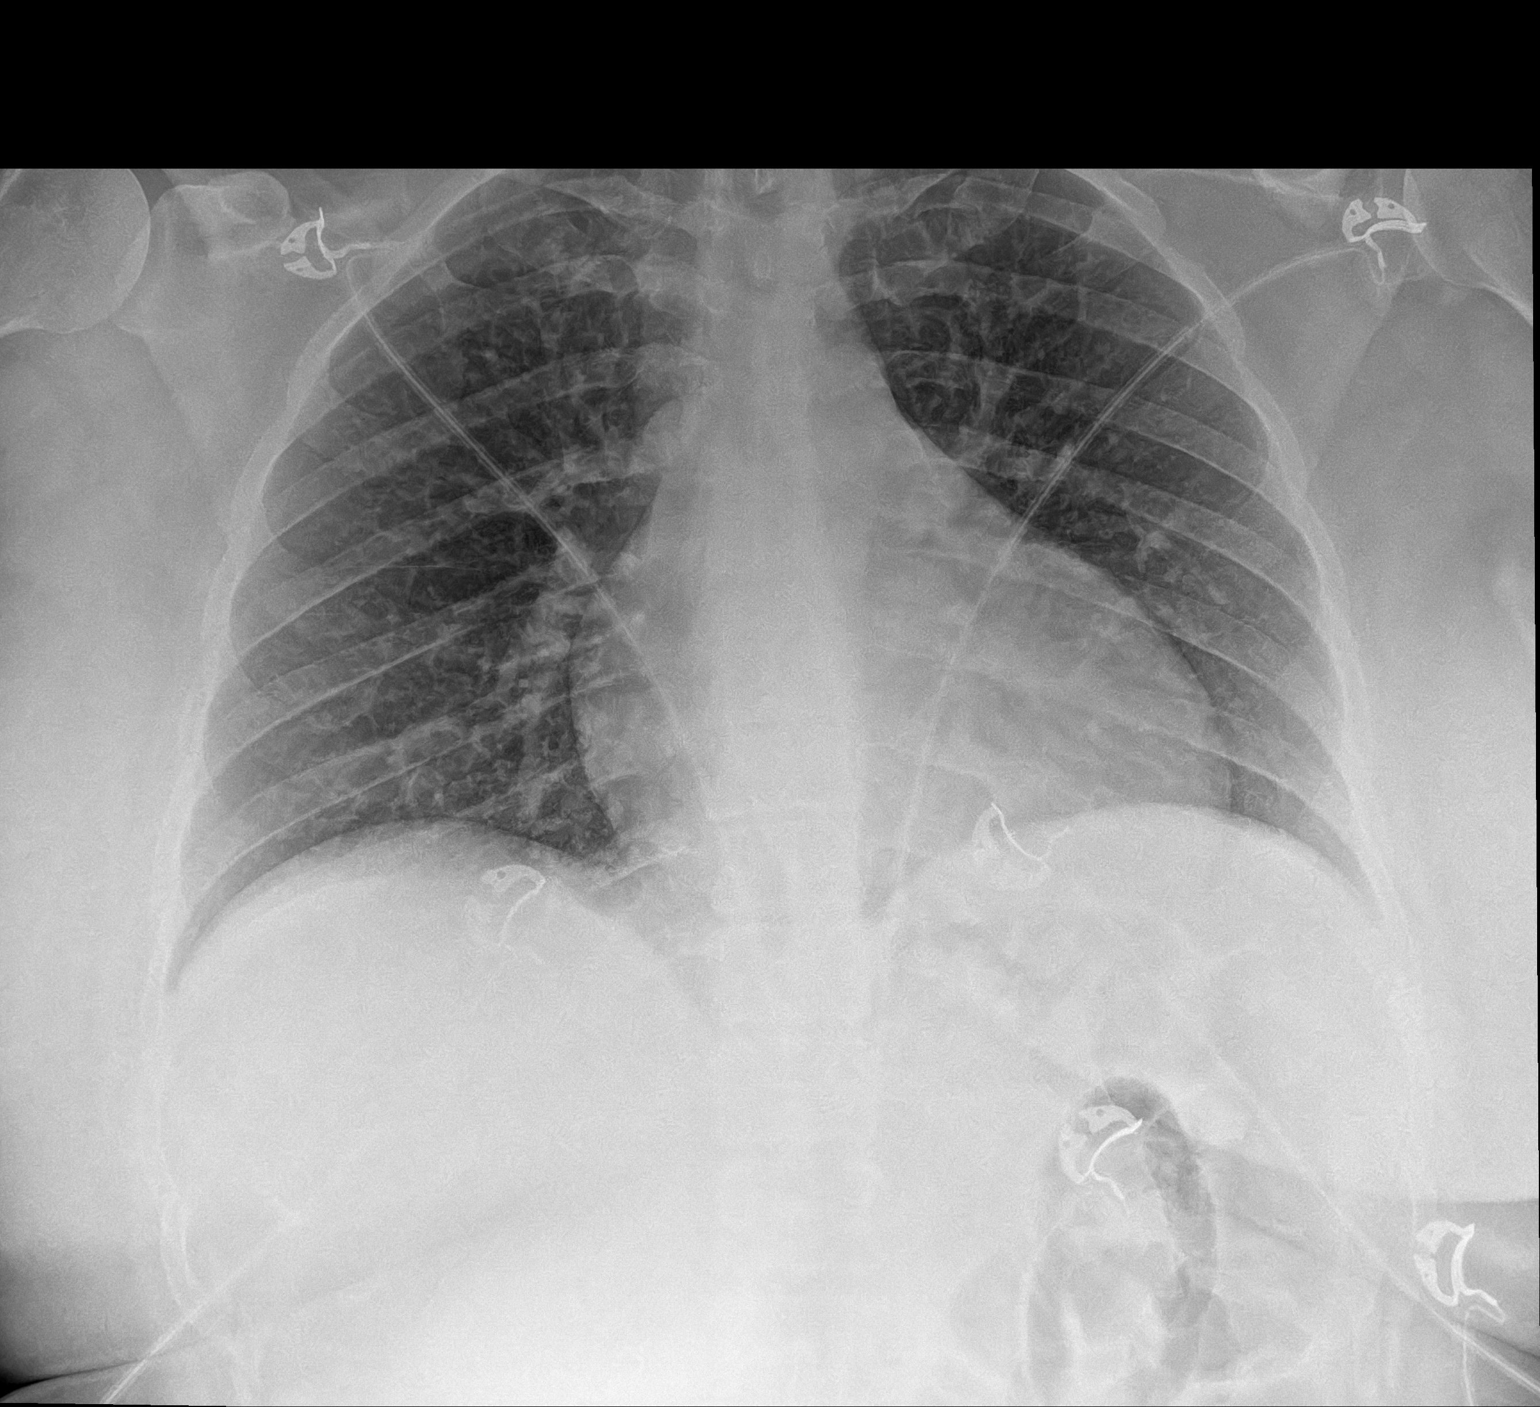

[1 of 1 positions shown; findings below may reference images not displayed]

FINDINGS: The heart size and mediastinal contours are within normal limits.
Both lungs are clear. The visualized skeletal structures are
unremarkable.
IMPRESSION: No active disease.

## 2023-09-03 IMAGING — CT CT ABD-PELV W/ CM
2 of 4 series · 17 of 46 positions shown, 19 images · IV contrast (APPLIED)
Comparison: 08/12/2021

CLINICAL DATA: Left upper quadrant pain

EXAM:
CT ABDOMEN AND PELVIS WITH CONTRAST
TECHNIQUE: Multidetector CT imaging of the abdomen and pelvis was performed
using the standard protocol following bolus administration of
intravenous contrast.

[Series 2: abd pel w · axial · 0.92mm/px · z∈[-537,-112]mm · 14 of 93 slices shown, 16 images]
[im 4/93  soft-tissue]
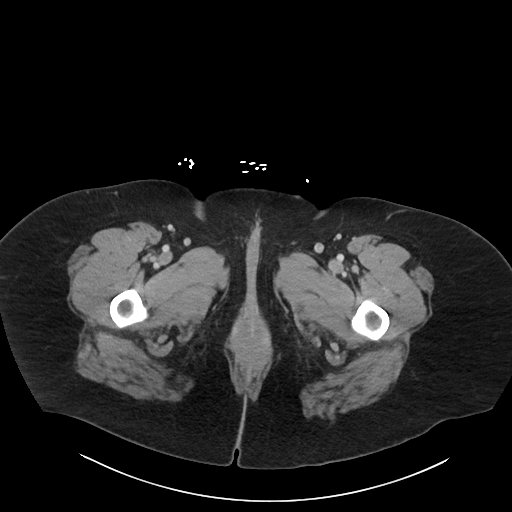
[im 4/93  bone]
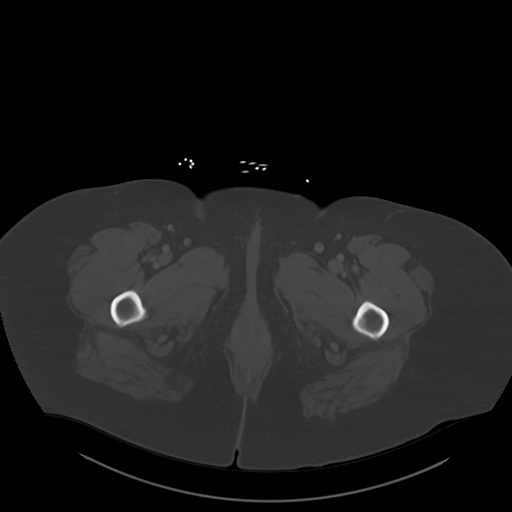
[im 11/93  soft-tissue]
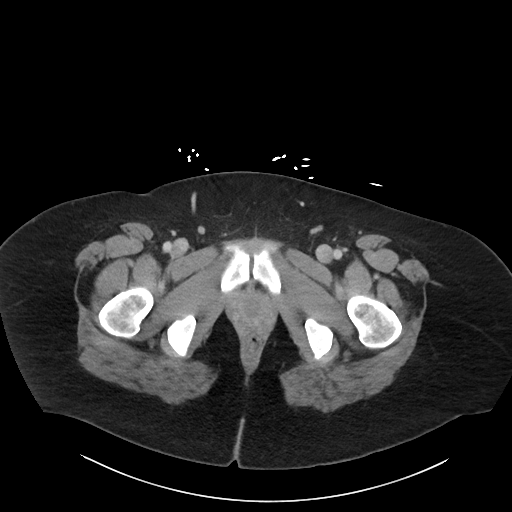
[im 18/93  soft-tissue]
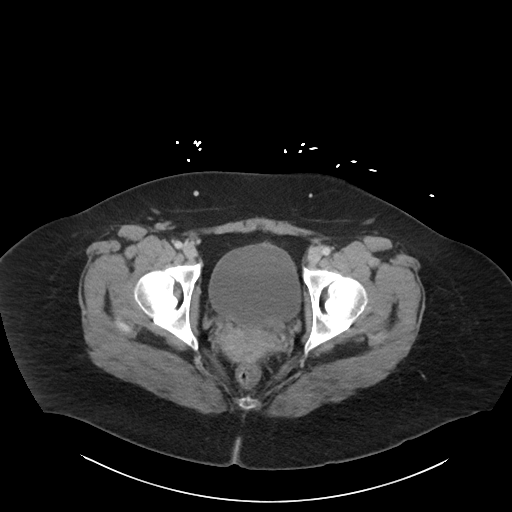
[im 25/93  soft-tissue]
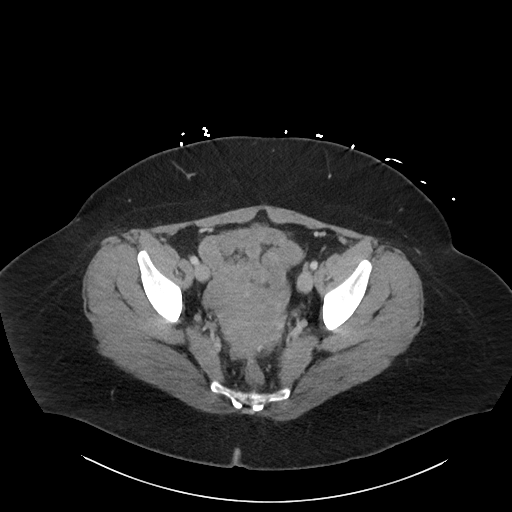
[im 32/93  soft-tissue]
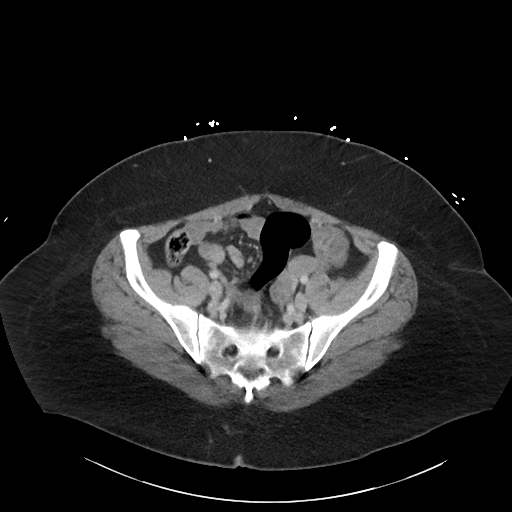
[im 36/93  soft-tissue]
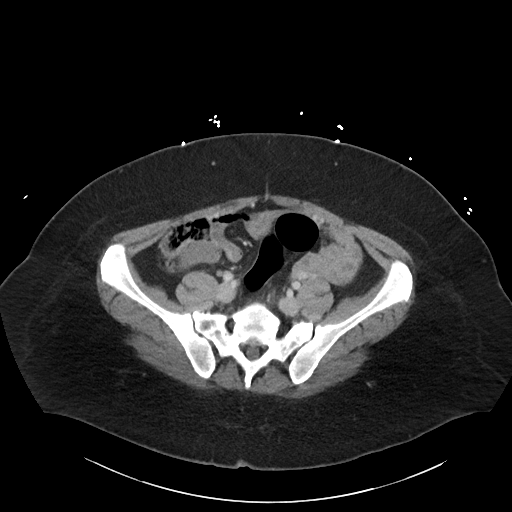
[im 43/93  soft-tissue]
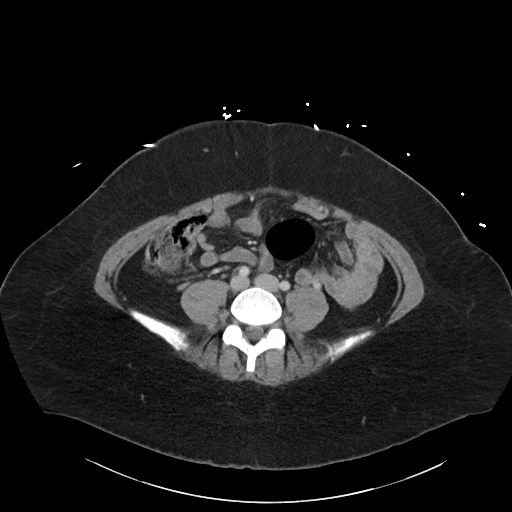
[im 50/93  soft-tissue]
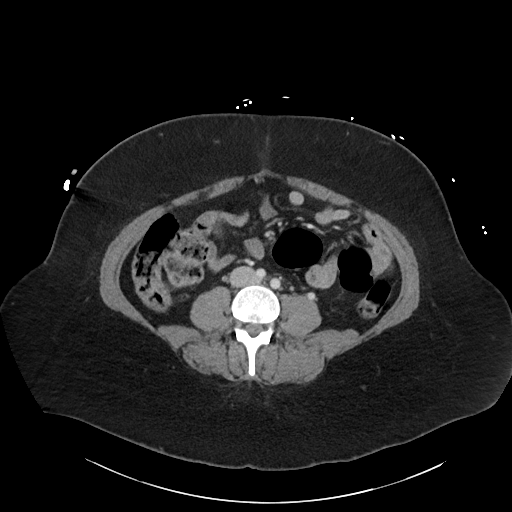
[im 57/93  soft-tissue]
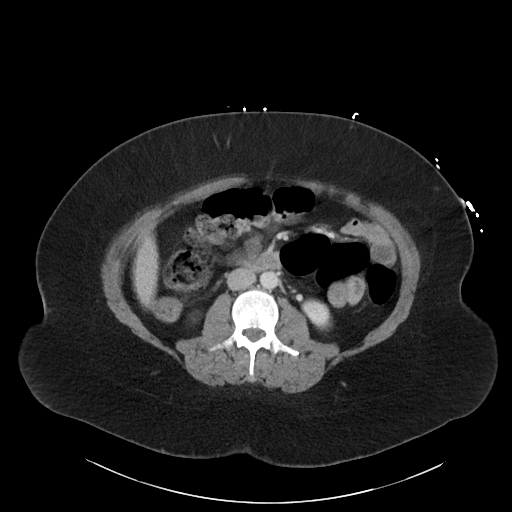
[im 57/93  bone]
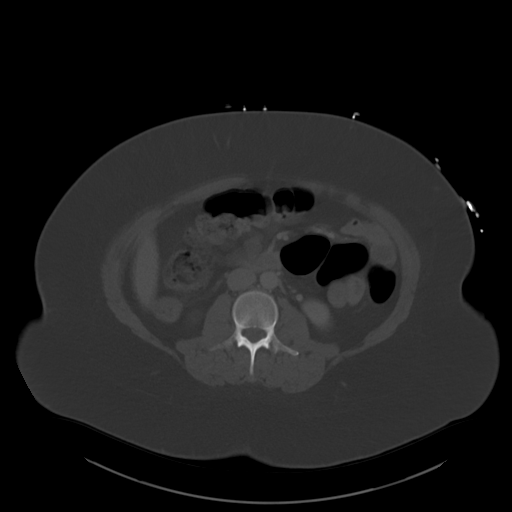
[im 61/93  soft-tissue]
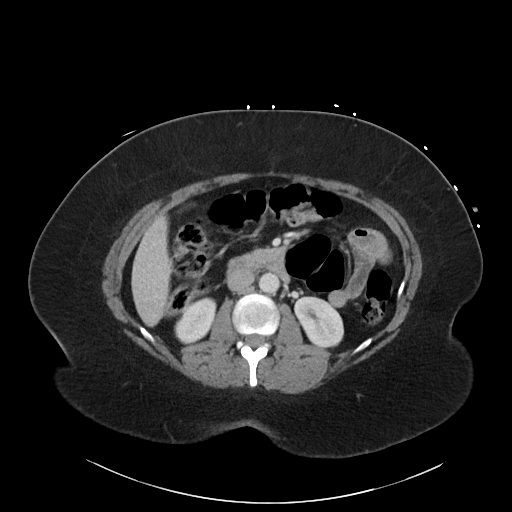
[im 68/93  soft-tissue]
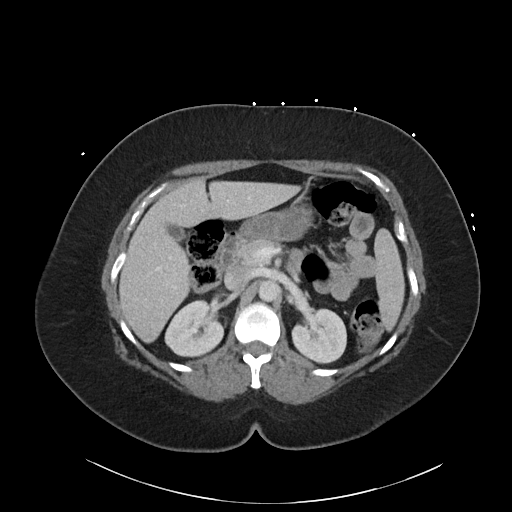
[im 75/93  soft-tissue]
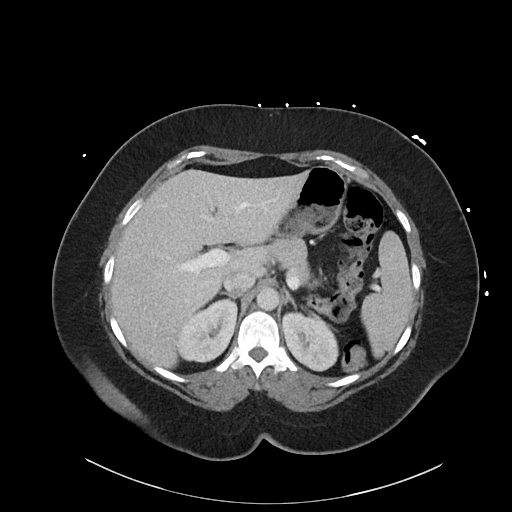
[im 82/93  soft-tissue]
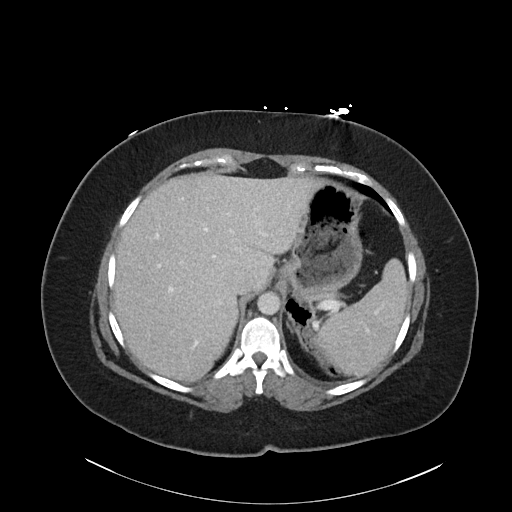
[im 89/93  soft-tissue]
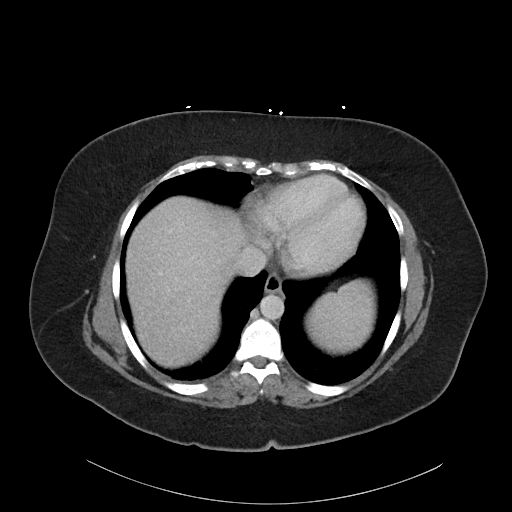

[Series 6: coronal · coronal · 0.93mm/px · 3 of 117 slices shown]
[im 39/117  soft-tissue]
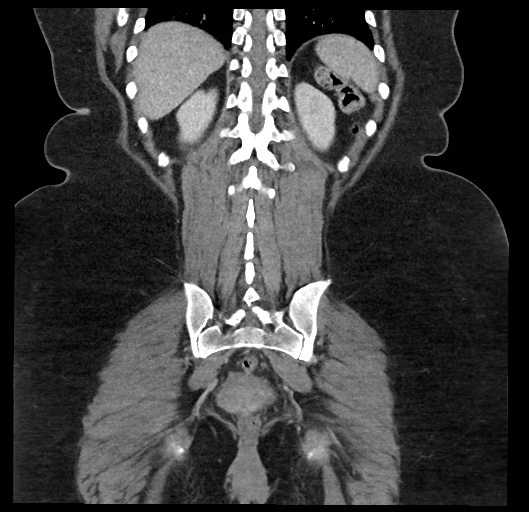
[im 52/117  soft-tissue]
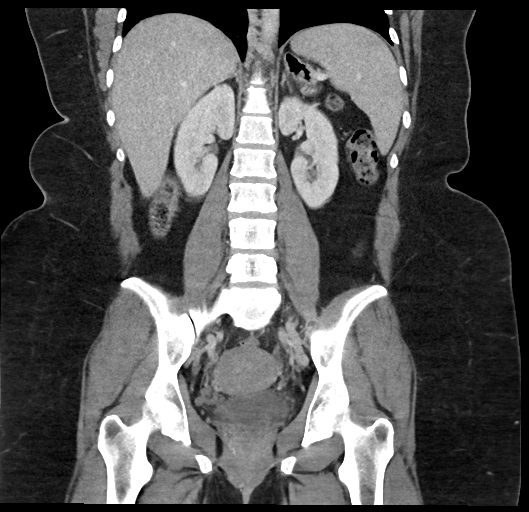
[im 65/117  soft-tissue]
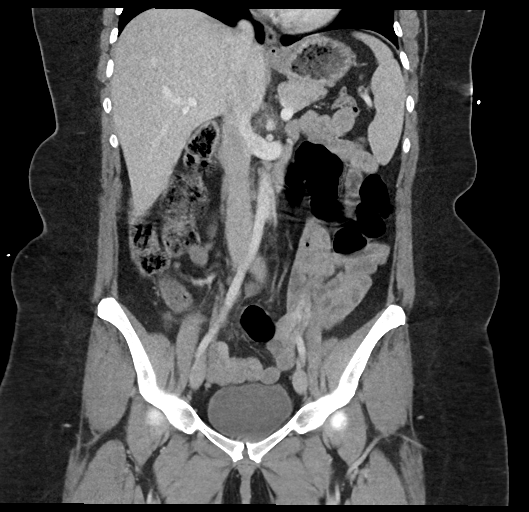

[17 of 46 positions shown; findings below may reference images not displayed]

RADIATION DOSE REDUCTION: This exam was performed according to the
departmental dose-optimization program which includes automated
exposure control, adjustment of the mA and/or kV according to
patient size and/or use of iterative reconstruction technique.

CONTRAST:  100mL OMNIPAQUE IOHEXOL 300 MG/ML  SOLN
FINDINGS: Lower chest: No acute abnormality

Hepatobiliary: No focal hepatic abnormality. Gallbladder
unremarkable.

Pancreas: No focal abnormality or ductal dilatation.

Spleen: Spleen measures 13 cm in craniocaudal length, stable since
prior study, borderline in size. No focal abnormality.

Adrenals/Urinary Tract: No adrenal abnormality. No focal renal
abnormality. No stones or hydronephrosis. Urinary bladder is
unremarkable.

Stomach/Bowel: Stomach, large and small bowel grossly unremarkable.
Normal appendix.

Vascular/Lymphatic: No evidence of aneurysm or adenopathy.

Reproductive: Uterus and adnexa unremarkable.  No mass.

Other: No free fluid or free air.

Musculoskeletal: No acute bony abnormality.
IMPRESSION: Borderline spleen size, stable since prior study.

No acute findings in the abdomen or pelvis.

## 2023-09-25 ENCOUNTER — Encounter: Payer: Self-pay | Admitting: Plastic Surgery

## 2023-09-25 ENCOUNTER — Ambulatory Visit (INDEPENDENT_AMBULATORY_CARE_PROVIDER_SITE_OTHER): Payer: Medicaid Other | Admitting: Plastic Surgery

## 2023-09-25 VITALS — BP 119/69 | HR 59 | Ht 62.0 in | Wt 240.0 lb

## 2023-09-25 DIAGNOSIS — Z6841 Body Mass Index (BMI) 40.0 and over, adult: Secondary | ICD-10-CM

## 2023-09-25 DIAGNOSIS — E65 Localized adiposity: Secondary | ICD-10-CM

## 2023-09-25 NOTE — Progress Notes (Signed)
 Referring Provider Center, [facility] [address] [place],  [place] [address]   CC:  Chief Complaint  Patient presents with   Consult      Lori Raymond is an 33 y.o. female.  HPI: Lori Raymond is a 33 year old female who presents today with complaints of asymmetric fat deposition on the upper anterior abdominal wall on the left.  She states that she never noticed this asymmetry until after completing a course of Paxlovid for COVID pneumonia approximately 2 to 2-1/2 years ago.  She states that the asymmetric fat deposition has been associated with pain on the left side.  She also notes that it seems to be associated with occasional shortness of breath.  She has been seen by an oncologist and a general surgeon.  Neither one of the specialist have been able to determine a cause for the asymmetric swelling.  Specifically with the general surgeon she was told she did not have a lipoma which was the initial diagnosis.  She has had a CT scan which does not show any discrete mass on the left side.  Her past medical history is complicated by a history of congestive heart failure which seems to have resolved.  She is followed by a cardiologist at [facility] for this.  Allergies  Allergen Reactions   Lexapro [Escitalopram Oxalate] Anaphylaxis   Sulfa Antibiotics Rash and Hives   Estrogens     stroke   Benadryl [Diphenhydramine] Palpitations   Latex Rash    Outpatient Encounter Medications as of 09/25/2023  Medication Sig   albuterol (VENTOLIN HFA) 108 (90 Base) MCG/ACT inhaler Inhale 1-2 puffs into the lungs every 6 (six) hours as needed for wheezing or shortness of breath.   pantoprazole (PROTONIX) 40 MG tablet Take 1 tablet (40 mg total) by mouth daily.   benzonatate (TESSALON) 100 MG capsule Take 1 capsule (100 mg total) by mouth 3 (three) times daily as needed. (Patient not taking: Reported on 09/25/2023)   brompheniramine-pseudoephedrine-DM 30-2-10 MG/5ML syrup Take 5 mLs by mouth  4 (four) times daily as needed. (Patient not taking: Reported on 09/25/2023)   ondansetron (ZOFRAN) 4 MG tablet Take 1 tablet (4 mg total) by mouth every 8 (eight) hours as needed for nausea or vomiting. (Patient not taking: Reported on 09/25/2023)   predniSONE (DELTASONE) 20 MG tablet Take 2 tablets (40 mg total) by mouth daily with breakfast. (Patient not taking: Reported on 09/25/2023)   promethazine (PHENERGAN) 25 MG tablet Take by mouth. (Patient not taking: Reported on 09/25/2023)   [DISCONTINUED] fluconazole (DIFLUCAN) 150 MG tablet Take 1 tablet (150 mg total) by mouth every 3 (three) days as needed.   No facility-administered encounter medications on file as of 09/25/2023.     Past Medical History:  Diagnosis Date   Cardiomyopathy [facility])    CHF (congestive heart failure) (HCC)    Depression    Endometriosis    Herpes    IBS (irritable bowel syndrome)    Oral ulcer 12/17/2022   Polycystic ovarian disease    PTSD (post-traumatic stress disorder)    Stroke [facility])    Vitamin D deficiency    Vulvar ulcer 12/17/2022    Past Surgical History:  Procedure Laterality Date   TONSILLECTOMY      No family history on file.  Social History   Social History Narrative   Not on file     Review of Systems General: Denies fevers, chills, weight loss CV: Denies chest pain, shortness of breath, palpitations Abdomen: Patient  has an area approximately 20 x 5 cm of asymmetric fat deposition that she states is occasionally painful and has only occurred in the past 2 to 2-1/2 years.  Physical Exam    09/25/2023    8:16 AM 09/27/2022    8:46 PM 09/27/2022    6:50 PM  Vitals with BMI  Height 5\' 2"     Weight 240 lbs    BMI 43.89    Systolic 119 111 161  Diastolic 69 59 62  Pulse 59 62 70    General:  No acute distress,  Alert and oriented, Non-Toxic, Normal speech and affect Abdomen: As noted above there is a area of asymmetric fat deposition on the left upper abdomen.  There is no  discrete mass in this area and the tissue is soft and nontender to palpation. Mammogram: Not applicable Assessment/Plan Asymmetric fat deposition: I reviewed the patient's CT scan and examined her and I agree that I do not feel a discrete mass in this area and do not believe that this is a lipoma.  I believe that she has had asymmetric fat deposition for reasons that are unclear but it is unlikely that there is any underlying pathology.  The patient is morbidly obese and does have fat throughout the abdominal region.  I have discussed with her that I also do not believe that this is necessarily the cause of her pain and her discomfort may be from the underlying ribs. The only thing I have to offer her is spot liposuction to try to gain some asymmetry.  She understands that this comes with the risk of over resection of the area and having asymmetry with less fat on the left side.  I also do not believe that resection will improve any pain that she is having.  It most certainly will not change any shortness of breath that she has having.  She ask about upper body liposuction for reduction.  I have told her I do not do this.  She is in the process of trying to lose weight.  I have strongly encouraged her to continue on her weight loss journey and to not consider spot liposuction until she has lost all the weight that she wants to lose.  Will provide her with a quote for spot liposuction.  If she chooses to have liposuction she will need a follow-up appointment for pictures and discussion.  She will also need clearance from a pulmonologist her cardiologist and her primary care physician.  None of these clearances have been placed as she is not currently being scheduled for surgery.  Santiago Glad 09/25/2023, 8:43 AM

## 2023-10-08 ENCOUNTER — Ambulatory Visit: Admitting: Podiatry

## 2023-10-08 DIAGNOSIS — M7751 Other enthesopathy of right foot: Secondary | ICD-10-CM | POA: Diagnosis not present

## 2023-10-08 DIAGNOSIS — L600 Ingrowing nail: Secondary | ICD-10-CM

## 2023-10-08 NOTE — Progress Notes (Signed)
 Subjective:  Patient ID: Lori Raymond, female    DOB: 07-Feb-1991,  MRN: [id number]  Chief Complaint  Patient presents with   Foot Pain    33 y.o. female presents with the above complaint.  Patient presents with left hallux lateral border ingrown painful to touch is progressive gotten worse worse with ambulation is with pressure patient would like to have removed has not seen MRIs prior to seeing me pain scale 7 out of 10 dull aching nature she has secondary complaint of right ankle pain pain with range of motion of the joint pain while ambulating.  She has not seen anyone else prior to seeing me for this either.   Review of Systems: Negative except as noted in the HPI. Denies N/V/F/Ch.  Past Medical History:  Diagnosis Date   Cardiomyopathy (HCC)    CHF (congestive heart failure) (HCC)    Depression    Endometriosis    Herpes    IBS (irritable bowel syndrome)    Oral ulcer 12/17/2022   Polycystic ovarian disease    PTSD (post-traumatic stress disorder)    Stroke (HCC)    Vitamin D deficiency    Vulvar ulcer 12/17/2022    Current Outpatient Medications:    albuterol (VENTOLIN HFA) 108 (90 Base) MCG/ACT inhaler, Inhale 1-2 puffs into the lungs every 6 (six) hours as needed for wheezing or shortness of breath., Disp: 8 g, Rfl: 0   benzonatate (TESSALON) 100 MG capsule, Take 1 capsule (100 mg total) by mouth 3 (three) times daily as needed. (Patient not taking: Reported on 09/25/2023), Disp: 30 capsule, Rfl: 0   brompheniramine-pseudoephedrine-DM 30-2-10 MG/5ML syrup, Take 5 mLs by mouth 4 (four) times daily as needed. (Patient not taking: Reported on 09/25/2023), Disp: 120 mL, Rfl: 0   ondansetron (ZOFRAN) 4 MG tablet, Take 1 tablet (4 mg total) by mouth every 8 (eight) hours as needed for nausea or vomiting. (Patient not taking: Reported on 09/25/2023), Disp: 20 tablet, Rfl: 0   pantoprazole (PROTONIX) 40 MG tablet, Take 1 tablet (40 mg total) by mouth daily., Disp: 30 tablet, Rfl:  1   predniSONE (DELTASONE) 20 MG tablet, Take 2 tablets (40 mg total) by mouth daily with breakfast. (Patient not taking: Reported on 09/25/2023), Disp: 10 tablet, Rfl: 0   promethazine (PHENERGAN) 25 MG tablet, Take by mouth. (Patient not taking: Reported on 09/25/2023), Disp: , Rfl:   Social History   Tobacco Use  Smoking Status Never  Smokeless Tobacco Never    Allergies  Allergen Reactions   Lexapro [Escitalopram Oxalate] Anaphylaxis   Sulfa Antibiotics Rash and Hives   Estrogens     stroke   Benadryl [Diphenhydramine] Palpitations   Latex Rash   Objective:  There were no vitals filed for this visit. There is no height or weight on file to calculate BMI. Constitutional Well developed. Well nourished.  Vascular Dorsalis pedis pulses palpable bilaterally. Posterior tibial pulses palpable bilaterally. Capillary refill normal to all digits.  No cyanosis or clubbing noted. Pedal hair growth normal.  Neurologic Normal speech. Oriented to person, place, and time. Epicritic sensation to light touch grossly present bilaterally.  Dermatologic Painful ingrowing nail at lateral nail borders of the hallux nail left. No other open wounds. No skin lesions.  Orthopedic: Normal joint ROM without pain or crepitus bilaterally. No visible deformities. No bony tenderness.   Radiographs: None Assessment:   1. Capsulitis of right ankle   2. Ingrown left big toenail    Plan:  Patient was evaluated and  treated and all questions answered.  Right ankle capsulitis - All questions or concerns were discussed with the patient in extensive detail given the amount of pain that she is having she would benefit from steroid injection to help decrease inflammatory component surgical pain.  Patient agrees with plan like to proceed with steroid injection -A steroid injection was performed at right ankle using 1% plain Lidocaine and 10 mg of Kenalog. This was well tolerated.   Ingrown Nail,  left -Patient elects to proceed with minor surgery to remove ingrown toenail removal today. Consent reviewed and signed by patient. -Ingrown nail excised. See procedure note. -Educated on post-procedure care including soaking. Written instructions provided and reviewed. -Patient to follow up in 2 weeks for nail check.  Procedure: Excision of Ingrown Toenail Location: Left 1st toe lateral nail borders. Anesthesia: Lidocaine 1% plain; 1.5 mL and Marcaine 0.5% plain; 1.5 mL, digital block. Skin Prep: Betadine. Dressing: Silvadene; telfa; dry, sterile, compression dressing. Technique: Following skin prep, the toe was exsanguinated and a tourniquet was secured at the base of the toe. The affected nail border was freed, split with a nail splitter, and excised. Chemical matrixectomy was then performed with phenol and irrigated out with alcohol. The tourniquet was then removed and sterile dressing applied. Disposition: Patient tolerated procedure well. Patient to return in 2 weeks for follow-up.   No follow-ups on file.

## 2023-10-08 NOTE — Patient Instructions (Signed)

## 2023-10-16 ENCOUNTER — Ambulatory Visit: Payer: Medicaid Other | Admitting: Dermatology

## 2023-10-16 ENCOUNTER — Encounter: Payer: Self-pay | Admitting: Dermatology

## 2023-10-16 DIAGNOSIS — L700 Acne vulgaris: Secondary | ICD-10-CM | POA: Diagnosis not present

## 2023-10-16 DIAGNOSIS — L738 Other specified follicular disorders: Secondary | ICD-10-CM

## 2023-10-16 DIAGNOSIS — D229 Melanocytic nevi, unspecified: Secondary | ICD-10-CM

## 2023-10-16 DIAGNOSIS — D2361 Other benign neoplasm of skin of right upper limb, including shoulder: Secondary | ICD-10-CM | POA: Diagnosis not present

## 2023-10-16 DIAGNOSIS — D239 Other benign neoplasm of skin, unspecified: Secondary | ICD-10-CM

## 2023-10-16 DIAGNOSIS — D2362 Other benign neoplasm of skin of left upper limb, including shoulder: Secondary | ICD-10-CM | POA: Diagnosis not present

## 2023-10-16 DIAGNOSIS — Z7189 Other specified counseling: Secondary | ICD-10-CM

## 2023-10-16 DIAGNOSIS — Z79899 Other long term (current) drug therapy: Secondary | ICD-10-CM

## 2023-10-16 MED ORDER — TRETINOIN 0.05 % EX CREA: TOPICAL_CREAM | Freq: Every day | CUTANEOUS | 11 refills | Status: AC

## 2023-10-16 NOTE — Progress Notes (Signed)
   New Patient Visit   Subjective  Lori Raymond is a 33 y.o. female who presents for the following: Pt c/o bumps on the face and two irregular appearing lesions on each forearm. Fhx of skin cancer, unsure which type. The patient has spots, moles and lesions to be evaluated, some may be new or changing and the patient may have concern these could be cancer.  The following portions of the chart were reviewed this encounter and updated as appropriate: medications, allergies, medical history  Review of Systems:  No other skin or systemic complaints except as noted in HPI or Assessment and Plan.  Objective  Well appearing patient in no apparent distress; mood and affect are within normal limits.  A focused examination was performed of the following areas: the face and arms  Relevant exam findings are noted in the Assessment and Plan.   Assessment & Plan   ACNE VULGARIS - pt does not plain on becoming pregnant, she will stop medications if that changes. Exam: Resolving acne papules and enlarged pores. Chronic and persistent condition with duration or expected duration over one year. Condition is bothersome/symptomatic for patient. Currently flared. Treatment Plan: Start Tretinoin 0.05% cream QHS.  Topical retinoid medications like tretinoin/Retin-A, adapalene/Differin, tazarotene/Fabior, and Epiduo/Epiduo Forte can cause dryness and irritation when first started. Only apply a pea-sized amount to the entire affected area. Avoid applying it around the eyes, edges of mouth and creases at the nose. If you experience irritation, use a good moisturizer first and/or apply the medicine less often. If you are doing well with the medicine, you can increase how often you use it until you are applying every night. Be careful with sun protection while using this medication as it can make you sensitive to the sun. This medicine should not be used by pregnant women.   Sebaceous Hyperplasia/acne - Face -  Small yellow papules with a central dell - Benign-appearing - Observe. Call for changes. - Start Tretinoin 0.05% cream QHS. Discussed isotretinoin, but patient is not a candidate for at this time.  May consider in the future if worsens and she gets multiple lesions.  DERMATOFIBROMA - L mid dorsum forearm 0.6 x 0.3 cm, R mid dorsum forearm 0.5 cm           Exam: Firm pink/brown papulenodule with dimple sign. Treatment Plan: A dermatofibroma is a benign growth possibly related to trauma, such as an insect bite, cut from shaving, or inflamed acne-type bump.  Treatment options to remove include shave or excision with resulting scar and risk of recurrence.  Since benign-appearing and not bothersome, will observe for now.  MELANOCYTIC NEVI Exam: Tan-brown and/or pink-flesh-colored symmetric macules and papules Treatment Plan: Benign appearing on exam today. Recommend observation. Call clinic for new or changing moles. Recommend daily use of broad spectrum spf 30+ sunscreen to sun-exposed areas.   ACNE VULGARIS   DERMATOFIBROMA   SEBACEOUS HYPERPLASIA OF FACE   COUNSELING AND COORDINATION OF CARE   MEDICATION MANAGEMENT    Return in about 1 year (around 10/15/2024) for acne follow up.  Maylene Roes, CMA, am acting as scribe for Armida Sans, MD .   Documentation: I have reviewed the above documentation for accuracy and completeness, and I agree with the above.  Armida Sans, MD

## 2023-10-16 NOTE — Patient Instructions (Addendum)

## 2024-04-23 ENCOUNTER — Telehealth

## 2024-04-23 ENCOUNTER — Telehealth: Admitting: Physician Assistant

## 2024-04-23 DIAGNOSIS — J0110 Acute frontal sinusitis, unspecified: Secondary | ICD-10-CM | POA: Diagnosis not present

## 2024-04-23 MED ORDER — FLUTICASONE PROPIONATE 50 MCG/ACT NA SUSP: 2.0000 | Freq: Every day | NASAL | 0 refills | Status: AC

## 2024-04-23 MED ORDER — BENZONATATE 100 MG PO CAPS: 100.0000 mg | ORAL_CAPSULE | Freq: Three times a day (TID) | ORAL | 0 refills | Status: AC | PRN

## 2024-04-23 MED ORDER — AMOXICILLIN-POT CLAVULANATE 875-125 MG PO TABS: 1.0000 | ORAL_TABLET | Freq: Two times a day (BID) | ORAL | 0 refills | Status: AC

## 2024-04-23 NOTE — Patient Instructions (Signed)
 Lori Raymond, thank you for joining Lori Velma Lunger, PA-C for today's virtual visit.  While this provider is not your primary care provider (PCP), if your PCP is located in our provider database this encounter information will be shared with them immediately following your visit.   A [facility] MyChart account gives you access to today's visit and all your visits, tests, and labs performed at [facility]  click here if you don't have a [facility] MyChart account or go to [email or web address]  Consent: (Patient) Lori Raymond provided verbal consent for this virtual visit at the beginning of the encounter.  Current Medications:  Current Outpatient Medications:    acyclovir (ZOVIRAX) 400 MG tablet, Take 400 mg by mouth as needed., Disp: , Rfl:    albuterol  (VENTOLIN  HFA) 108 (90 Base) MCG/ACT inhaler, Inhale 1-2 puffs into the lungs every 6 (six) hours as needed for wheezing or shortness of breath. (Patient not taking: Reported on 10/16/2023), Disp: 8 g, Rfl: 0   benzonatate  (TESSALON ) 100 MG capsule, Take 1 capsule (100 mg total) by mouth 3 (three) times daily as needed. (Patient not taking: Reported on 10/16/2023), Disp: 30 capsule, Rfl: 0   brompheniramine-pseudoephedrine-DM 30-2-10 MG/5ML syrup, Take 5 mLs by mouth 4 (four) times daily as needed. (Patient not taking: Reported on 10/16/2023), Disp: 120 mL, Rfl: 0   ondansetron  (ZOFRAN ) 4 MG tablet, Take 1 tablet (4 mg total) by mouth every 8 (eight) hours as needed for nausea or vomiting. (Patient not taking: Reported on 10/16/2023), Disp: 20 tablet, Rfl: 0   pantoprazole  (PROTONIX ) 40 MG tablet, Take 1 tablet (40 mg total) by mouth daily., Disp: 30 tablet, Rfl: 1   predniSONE  (DELTASONE ) 20 MG tablet, Take 2 tablets (40 mg total) by mouth daily with breakfast. (Patient not taking: Reported on 10/16/2023), Disp: 10 tablet, Rfl: 0   promethazine (PHENERGAN) 25 MG tablet, Take by mouth. (Patient not taking: Reported on  10/16/2023), Disp: , Rfl:    tretinoin  (RETIN-A ) 0.05 % cream, Apply topically at bedtime. Apply a pea sized amount to the entire face QHS., Disp: 45 g, Rfl: 11   Medications ordered in this encounter:  No orders of the defined types were placed in this encounter.    *If you need refills on other medications prior to your next appointment, please contact your pharmacy*  Follow-Up: Call back or seek an in-person evaluation if the symptoms worsen or if the condition fails to improve as anticipated.  [facility] [phone]  Other Instructions Please take antibiotic as directed.  Increase fluid intake.  Use Saline nasal spray.  Take a daily multivitamin. Take other prescribed medications as directed.  Place a humidifier in the bedroom.  Please call or return clinic if symptoms are not improving.  Sinusitis Sinusitis is redness, soreness, and swelling (inflammation) of the paranasal sinuses. Paranasal sinuses are air pockets within the bones of your face (beneath the eyes, the middle of the forehead, or above the eyes). In healthy paranasal sinuses, mucus is able to drain out, and air is able to circulate through them by way of your nose. However, when your paranasal sinuses are inflamed, mucus and air can become trapped. This can allow bacteria and other germs to grow and cause infection. Sinusitis can develop quickly and last only a short time (acute) or continue over a long period (chronic). Sinusitis that lasts for more than 12 weeks is considered chronic.  CAUSES  Causes of sinusitis include: Allergies. Structural abnormalities, such as  displacement of the cartilage that separates your nostrils (deviated septum), which can decrease the air flow through your nose and sinuses and affect sinus drainage. Functional abnormalities, such as when the small hairs (cilia) that line your sinuses and help remove mucus do not work properly or are not present. SYMPTOMS  Symptoms of acute  and chronic sinusitis are the same. The primary symptoms are pain and pressure around the affected sinuses. Other symptoms include: Upper toothache. Earache. Headache. Bad breath. Decreased sense of smell and taste. A cough, which worsens when you are lying flat. Fatigue. Fever. Thick drainage from your nose, which often is green and may contain pus (purulent). Swelling and warmth over the affected sinuses. DIAGNOSIS  Your caregiver will perform a physical exam. During the exam, your caregiver may: Look in your nose for signs of abnormal growths in your nostrils (nasal polyps). Tap over the affected sinus to check for signs of infection. View the inside of your sinuses (endoscopy) with a special imaging device with a light attached (endoscope), which is inserted into your sinuses. If your caregiver suspects that you have chronic sinusitis, one or more of the following tests may be recommended: Allergy tests. Nasal culture A sample of mucus is taken from your nose and sent to a lab and screened for bacteria. Nasal cytology A sample of mucus is taken from your nose and examined by your caregiver to determine if your sinusitis is related to an allergy. TREATMENT  Most cases of acute sinusitis are related to a viral infection and will resolve on their own within 10 days. Sometimes medicines are prescribed to help relieve symptoms (pain medicine, decongestants, nasal steroid sprays, or saline sprays).  However, for sinusitis related to a bacterial infection, your caregiver will prescribe antibiotic medicines. These are medicines that will help kill the bacteria causing the infection.  Rarely, sinusitis is caused by a fungal infection. In theses cases, your caregiver will prescribe antifungal medicine. For some cases of chronic sinusitis, surgery is needed. Generally, these are cases in which sinusitis recurs more than 3 times per year, despite other treatments. HOME CARE INSTRUCTIONS  Drink  plenty of water. Water helps thin the mucus so your sinuses can drain more easily. Use a humidifier. Inhale steam 3 to 4 times a day (for example, sit in the bathroom with the shower running). Apply a warm, moist washcloth to your face 3 to 4 times a day, or as directed by your caregiver. Use saline nasal sprays to help moisten and clean your sinuses. Take over-the-counter or prescription medicines for pain, discomfort, or fever only as directed by your caregiver. SEEK IMMEDIATE MEDICAL CARE IF: You have increasing pain or severe headaches. You have nausea, vomiting, or drowsiness. You have swelling around your face. You have vision problems. You have a stiff neck. You have difficulty breathing. MAKE SURE YOU:  Understand these instructions. Will watch your condition. Will get help right away if you are not doing well or get worse. Document Released: 06/18/2005 Document Revised: 09/10/2011 Document Reviewed: 07/03/2011 [facility] Patient Information 2014 [place], [place].    If you have been instructed to have an in-person evaluation today at a local Urgent Care facility, please use the link below. It will take you to a list of all of our available [facility], including address, phone number and hours of operation. Please do not delay care.  [facility]  If you or a family member do not have a primary care provider, use  the link below to schedule a visit and establish care. When you choose a [facility] primary care physician or advanced practice provider, you gain a long-term partner in health. Find a Primary Care Provider  Learn more about [facility]'s in-office and virtual care options: [facility] - Get Care Now

## 2024-04-23 NOTE — Progress Notes (Signed)
 Virtual Visit Consent   Lori Raymond, you are scheduled for a virtual visit with a [facility] provider today. Just as with appointments in the office, your consent must be obtained to participate. Your consent will be active for this visit and any virtual visit you may have with one of our providers in the next 365 days. If you have a MyChart account, a copy of this consent can be sent to you electronically.  As this is a virtual visit, video technology does not allow for your provider to perform a traditional examination. This may limit your provider's ability to fully assess your condition. If your provider identifies any concerns that need to be evaluated in person or the need to arrange testing (such as labs, EKG, etc.), we will make arrangements to do so. Although advances in technology are sophisticated, we cannot ensure that it will always work on either your end or our end. If the connection with a video visit is poor, the visit may have to be switched to a telephone visit. With either a video or telephone visit, we are not always able to ensure that we have a secure connection.  By engaging in this virtual visit, you consent to the provision of healthcare and authorize for your insurance to be billed (if applicable) for the services provided during this visit. Depending on your insurance coverage, you may receive a charge related to this service.  I need to obtain your verbal consent now. Are you willing to proceed with your visit today? Lori Raymond has provided verbal consent on 04/23/2024 for a virtual visit (video or telephone). Lori Raymond, [place]  Date: 04/23/2024 10:23 AM   Virtual Visit via Video Note   I, Lori Raymond, connected with  Lori Raymond  ([id number], 25-Sep-1990) on 04/23/24 at 10:15 AM EDT by a video-enabled telemedicine application and verified that I am speaking with the correct person using two identifiers.  Location: Patient: Virtual Visit  Location Patient: Home Provider: Virtual Visit Location Provider: Home Office   I discussed the limitations of evaluation and management by telemedicine and the availability of in person appointments. The patient expressed understanding and agreed to proceed.    History of Present Illness: Lori Raymond is a 33 y.o. who identifies as a female who was assigned female at birth, and is being seen today for 4 days of progressively worsening nasal congestion, sinus pressure and sinus pain, along with some chest congestion, ear pressure/popping and pain. Denies fever, chills. Notes some nasal congestion and allergy like symptoms the week leading up to these worsening symptoms. Notes children sick recently with HFM but she did not get sick around that time. Denies sore throat, oral lesions or rash.   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   PTSD (post-traumatic  stress disorder) 12/04/2016   VT (ventricular tachycardia) (HCC) 11/14/2016   Headache, unspecified 12/30/2014   Left arm numbness 12/30/2014   Right sided weakness 12/30/2014   Body mass index (BMI) 35.0-35.9, adult 06/11/2013   History of postpartum depression 02/26/2013   PAC (premature atrial contraction) 08/11/2012   History of PCOS 05/13/2012   Weight loss 05/13/2012    Allergies:  Allergies  Allergen Reactions   Lexapro [Escitalopram Oxalate] Anaphylaxis   Sulfa Antibiotics Rash and Hives   Estrogens     stroke   Benadryl [Diphenhydramine] Palpitations   Latex Rash   Medications:  Current Outpatient Medications:    amoxicillin-clavulanate (AUGMENTIN) 875-125 MG tablet, Take 1 tablet by mouth 2 (two) times daily., Disp: 14 tablet, Rfl: 0   benzonatate  (TESSALON ) 100 MG capsule, Take 1 capsule (100 mg total) by mouth 3 (three) times daily as needed for cough., Disp: 30 capsule, Rfl: 0   fluticasone (FLONASE) 50 MCG/ACT nasal spray, Place 2 sprays into both nostrils daily., Disp: 16 g, Rfl: 0   acyclovir (ZOVIRAX) 400 MG tablet, Take 400 mg by mouth as needed., Disp: , Rfl:    tretinoin  (RETIN-A ) 0.05 % cream, Apply topically at bedtime. Apply a pea sized amount to the entire face QHS., Disp: 45 g, Rfl: 11  Observations/Objective: Patient is well-developed, well-nourished in no acute distress.  Resting comfortably at home.  Head is normocephalic, atraumatic.  No labored breathing. Speech is clear and coherent with logical content.  Patient is alert and oriented at baseline.   Assessment and Plan: 1. Acute non-recurrent frontal sinusitis (Primary) - benzonatate  (TESSALON ) 100 MG capsule; Take 1 capsule (100 mg total) by mouth 3 (three) times daily as needed for cough.  Dispense: 30 capsule; Refill: 0 - fluticasone (FLONASE) 50 MCG/ACT nasal spray; Place 2 sprays into both nostrils daily.  Dispense: 16 g; Refill: 0 - amoxicillin-clavulanate (AUGMENTIN) 875-125 MG tablet;  Take 1 tablet by mouth 2 (two) times daily.  Dispense: 14 tablet; Refill: 0  Rx Augmentin.  Increase fluids.  Rest.  Saline nasal spray.  Probiotic.  Mucinex as directed.  Humidifier in bedroom. Flonase and Tessalon  per orders.  Call or return to clinic if symptoms are not improving.   Follow Up Instructions: I discussed the assessment and treatment plan with the patient. The patient was provided an opportunity to ask questions and all were answered. The patient agreed with the plan and demonstrated an understanding of the instructions.  A copy of instructions were sent to the patient via MyChart unless otherwise noted below.   The patient was advised to call back or seek an in-person evaluation if the symptoms worsen or if the condition fails to improve as anticipated.    Lori Velma Lunger, PA-C

## 2024-06-09 ENCOUNTER — Other Ambulatory Visit: Payer: Self-pay

## 2024-06-09 DIAGNOSIS — I519 Heart disease, unspecified: Secondary | ICD-10-CM

## 2024-06-11 ENCOUNTER — Ambulatory Visit: Admission: RE | Admit: 2024-06-11 | Discharge: 2024-06-11

## 2024-06-11 DIAGNOSIS — I519 Heart disease, unspecified: Secondary | ICD-10-CM

## 2024-06-11 LAB — ECHOCARDIOGRAM COMPLETE
AR max vel: 3.25 cm2
AV Area VTI: 3.07 cm2
AV Area mean vel: 3.02 cm2
AV Mean grad: 6 mmHg
AV Peak grad: 10.6 mmHg
Ao pk vel: 1.63 m/s
Area-P 1/2: 4.1 cm2
S' Lateral: 3.8 cm

## 2024-06-18 ENCOUNTER — Ambulatory Visit

## 2024-06-30 ENCOUNTER — Ambulatory Visit (INDEPENDENT_AMBULATORY_CARE_PROVIDER_SITE_OTHER): Admitting: Podiatry

## 2024-06-30 DIAGNOSIS — M216X1 Other acquired deformities of right foot: Secondary | ICD-10-CM

## 2024-06-30 DIAGNOSIS — M7751 Other enthesopathy of right foot: Secondary | ICD-10-CM | POA: Diagnosis not present

## 2024-06-30 DIAGNOSIS — M216X2 Other acquired deformities of left foot: Secondary | ICD-10-CM | POA: Diagnosis not present

## 2024-06-30 NOTE — Progress Notes (Signed)
 "  Subjective:  Patient ID: Lori Raymond, female    DOB: December 19, 1990,  MRN: [id number]  Chief Complaint  Patient presents with   Foot Pain    Pt stated that her right ankle has been bothering her again she is wanting to get another injection     33 y.o. female presents with the above complaint.  Patient presents with right ankle pain.  She states has been going on for quite some time has progressive gotten worse.  She states she would like to do another injection last time gave her 8 months of relief.  She would also like to discuss orthotics she has not seen MRIs prior to seeing her for the orthotics.   Review of Systems: Negative except as noted in the HPI. Denies N/V/F/Ch.  Past Medical History:  Diagnosis Date   Cardiomyopathy [facility])    CHF (congestive heart failure) (HCC)    Depression    Endometriosis    Herpes    IBS (irritable bowel syndrome)    Oral ulcer 12/17/2022   Polycystic ovarian disease    PTSD (post-traumatic stress disorder)    Stroke (HCC)    Vitamin D deficiency    Vulvar ulcer 12/17/2022    Current Outpatient Medications:    acyclovir (ZOVIRAX) 400 MG tablet, Take 400 mg by mouth as needed., Disp: , Rfl:    amoxicillin -clavulanate (AUGMENTIN ) 875-125 MG tablet, Take 1 tablet by mouth 2 (two) times daily., Disp: 14 tablet, Rfl: 0   benzonatate  (TESSALON ) 100 MG capsule, Take 1 capsule (100 mg total) by mouth 3 (three) times daily as needed for cough., Disp: 30 capsule, Rfl: 0   fluticasone  (FLONASE ) 50 MCG/ACT nasal spray, Place 2 sprays into both nostrils daily., Disp: 16 g, Rfl: 0   tretinoin  (RETIN-A ) 0.05 % cream, Apply topically at bedtime. Apply a pea sized amount to the entire face QHS., Disp: 45 g, Rfl: 11  Social History   Tobacco Use  Smoking Status Never  Smokeless Tobacco Never    Allergies  Allergen Reactions   Lexapro [Escitalopram Oxalate] Anaphylaxis   Sulfa Antibiotics Rash and Hives   Estrogens     stroke   Benadryl  [Diphenhydramine] Palpitations   Latex Rash   Objective:  There were no vitals filed for this visit. There is no height or weight on file to calculate BMI. Constitutional Well developed. Well nourished.  Vascular Dorsalis pedis pulses palpable bilaterally. Posterior tibial pulses palpable bilaterally. Capillary refill normal to all digits.  No cyanosis or clubbing noted. Pedal hair growth normal.  Neurologic Normal speech. Oriented to person, place, and time. Epicritic sensation to light touch grossly present bilaterally.  Dermatologic Painful ingrowing nail at lateral nail borders of the hallux nail left. No other open wounds. No skin lesions.  Orthopedic: Normal joint ROM without pain or crepitus bilaterally. No visible deformities. No bony tenderness.   Radiographs: None Assessment:   1. Capsulitis of right ankle   2. Other acquired deformities of right foot   3. Other acquired deformities of left foot     Plan:  Patient was evaluated and treated and all questions answered.  Right ankle capsulitis - All questions or concerns were discussed with the patient in extensive detail given the amount of pain that she is having she would benefit from steroid injection to help decrease inflammatory component surgical pain.  Patient agrees with plan like to proceed with steroid injection -A steroid injection was performed at right ankle using 1% plain Lidocaine  and  10 mg of Kenalog. This was well tolerated.   Pes planovalgus/foot deformity -I explained to patient the etiology of pes planovalgus and relationship with heel pain/arch pain and various treatment options were discussed.  Given patient foot structure in the setting of heel pain/arch pain I believe patient will benefit from custom-made orthotics to help control the hindfoot motion support the arch of the foot and take the stress away from arches.  Patient agrees with the plan like to proceed with orthotics -Patient was  casted for orthotics   No follow-ups on file. "

## 2024-06-30 NOTE — Addendum Note (Signed)
 Addended by: Renette Hsu on: 06/30/2024 09:01 AM   Modules accepted: Level of Service

## 2024-07-17 ENCOUNTER — Telehealth: Payer: Self-pay | Admitting: Podiatry

## 2024-07-17 NOTE — Telephone Encounter (Signed)
 Orthoticas are in [place] office. Spoke to [employer] and she is schedule to [facility] on 07/28/2024. Orthotics are being sent to [employer].

## 2024-07-28 ENCOUNTER — Ambulatory Visit: Admitting: Podiatry

## 2024-08-11 ENCOUNTER — Ambulatory Visit: Admitting: Podiatry

## 2024-08-14 ENCOUNTER — Ambulatory Visit: Admitting: Pulmonary Disease

## 2024-10-20 ENCOUNTER — Ambulatory Visit: Admitting: Dermatology
